# Patient Record
Sex: Male | Born: 1940 | Race: White | Hispanic: No | Marital: Married | Smoking: Never smoker
Health system: Southern US, Community
[De-identification: ages and names within clinical notes are randomized; demographics above are authoritative.]

## PROBLEM LIST (undated history)

## (undated) DIAGNOSIS — N529 Male erectile dysfunction, unspecified: Secondary | ICD-10-CM

## (undated) DIAGNOSIS — R7303 Prediabetes: Secondary | ICD-10-CM

## (undated) DIAGNOSIS — M722 Plantar fascial fibromatosis: Secondary | ICD-10-CM

## (undated) DIAGNOSIS — E781 Pure hyperglyceridemia: Secondary | ICD-10-CM

## (undated) DIAGNOSIS — M5412 Radiculopathy, cervical region: Secondary | ICD-10-CM

## (undated) DIAGNOSIS — M7710 Lateral epicondylitis, unspecified elbow: Secondary | ICD-10-CM

## (undated) DIAGNOSIS — H269 Unspecified cataract: Secondary | ICD-10-CM

## (undated) DIAGNOSIS — K6389 Other specified diseases of intestine: Secondary | ICD-10-CM

## (undated) DIAGNOSIS — B3781 Candidal esophagitis: Secondary | ICD-10-CM

## (undated) DIAGNOSIS — D126 Benign neoplasm of colon, unspecified: Secondary | ICD-10-CM

## (undated) DIAGNOSIS — R51 Headache: Secondary | ICD-10-CM

## (undated) DIAGNOSIS — R519 Headache, unspecified: Secondary | ICD-10-CM

## (undated) DIAGNOSIS — M5416 Radiculopathy, lumbar region: Secondary | ICD-10-CM

## (undated) DIAGNOSIS — J309 Allergic rhinitis, unspecified: Secondary | ICD-10-CM

## (undated) DIAGNOSIS — M1990 Unspecified osteoarthritis, unspecified site: Secondary | ICD-10-CM

## (undated) DIAGNOSIS — S46219A Strain of muscle, fascia and tendon of other parts of biceps, unspecified arm, initial encounter: Secondary | ICD-10-CM

## (undated) DIAGNOSIS — K219 Gastro-esophageal reflux disease without esophagitis: Secondary | ICD-10-CM

## (undated) HISTORY — DX: Plantar fascial fibromatosis: M72.2

## (undated) HISTORY — DX: Gastro-esophageal reflux disease without esophagitis: K21.9

## (undated) HISTORY — DX: Radiculopathy, cervical region: M54.12

## (undated) HISTORY — DX: Allergic rhinitis, unspecified: J30.9

## (undated) HISTORY — DX: Unspecified osteoarthritis, unspecified site: M19.90

## (undated) HISTORY — DX: Headache, unspecified: R51.9

## (undated) HISTORY — DX: Benign neoplasm of colon, unspecified: D12.6

## (undated) HISTORY — DX: Radiculopathy, lumbar region: M54.16

## (undated) HISTORY — DX: Unspecified cataract: H26.9

## (undated) HISTORY — PX: TONSILLECTOMY: SUR1361

## (undated) HISTORY — DX: Strain of muscle, fascia and tendon of other parts of biceps, unspecified arm, initial encounter: S46.219A

## (undated) HISTORY — PX: EYE SURGERY: SHX253

## (undated) HISTORY — PX: COLONOSCOPY: SHX174

## (undated) HISTORY — DX: Other specified diseases of intestine: K63.89

## (undated) HISTORY — DX: Pure hyperglyceridemia: E78.1

## (undated) HISTORY — DX: Headache: R51

## (undated) HISTORY — DX: Lateral epicondylitis, unspecified elbow: M77.10

## (undated) HISTORY — DX: Candidal esophagitis: B37.81

## (undated) HISTORY — DX: Male erectile dysfunction, unspecified: N52.9

---

## 1991-12-11 HISTORY — PX: LUMBAR LAMINECTOMY: SHX95

## 2000-03-08 ENCOUNTER — Ambulatory Visit (HOSPITAL_COMMUNITY): Admission: RE | Admit: 2000-03-08 | Discharge: 2000-03-08 | Payer: Self-pay | Admitting: *Deleted

## 2001-03-03 ENCOUNTER — Encounter: Admission: RE | Admit: 2001-03-03 | Discharge: 2001-03-03 | Payer: Self-pay | Admitting: Internal Medicine

## 2001-03-03 ENCOUNTER — Encounter: Payer: Self-pay | Admitting: Internal Medicine

## 2001-03-10 ENCOUNTER — Encounter: Payer: Self-pay | Admitting: Internal Medicine

## 2001-03-10 ENCOUNTER — Ambulatory Visit (HOSPITAL_COMMUNITY): Admission: RE | Admit: 2001-03-10 | Discharge: 2001-03-10 | Payer: Self-pay | Admitting: Internal Medicine

## 2004-12-14 ENCOUNTER — Encounter: Admission: RE | Admit: 2004-12-14 | Discharge: 2004-12-14 | Payer: Self-pay | Admitting: Internal Medicine

## 2011-12-26 DIAGNOSIS — R079 Chest pain, unspecified: Secondary | ICD-10-CM | POA: Diagnosis not present

## 2012-01-04 DIAGNOSIS — R079 Chest pain, unspecified: Secondary | ICD-10-CM | POA: Diagnosis not present

## 2012-04-29 DIAGNOSIS — L570 Actinic keratosis: Secondary | ICD-10-CM | POA: Diagnosis not present

## 2012-04-29 DIAGNOSIS — D235 Other benign neoplasm of skin of trunk: Secondary | ICD-10-CM | POA: Diagnosis not present

## 2012-06-04 DIAGNOSIS — N529 Male erectile dysfunction, unspecified: Secondary | ICD-10-CM | POA: Diagnosis not present

## 2012-06-04 DIAGNOSIS — Z131 Encounter for screening for diabetes mellitus: Secondary | ICD-10-CM | POA: Diagnosis not present

## 2012-06-04 DIAGNOSIS — Z1331 Encounter for screening for depression: Secondary | ICD-10-CM | POA: Diagnosis not present

## 2012-06-04 DIAGNOSIS — Z0000 Encounter for general adult medical examination without abnormal findings: Secondary | ICD-10-CM | POA: Diagnosis not present

## 2012-06-24 DIAGNOSIS — Z09 Encounter for follow-up examination after completed treatment for conditions other than malignant neoplasm: Secondary | ICD-10-CM | POA: Diagnosis not present

## 2012-06-24 DIAGNOSIS — Z86010 Personal history of colonic polyps: Secondary | ICD-10-CM | POA: Diagnosis not present

## 2012-06-24 DIAGNOSIS — D126 Benign neoplasm of colon, unspecified: Secondary | ICD-10-CM | POA: Diagnosis not present

## 2012-07-29 DIAGNOSIS — H01009 Unspecified blepharitis unspecified eye, unspecified eyelid: Secondary | ICD-10-CM | POA: Diagnosis not present

## 2012-07-29 DIAGNOSIS — H04209 Unspecified epiphora, unspecified lacrimal gland: Secondary | ICD-10-CM | POA: Diagnosis not present

## 2012-07-29 DIAGNOSIS — H11049 Peripheral pterygium, stationary, unspecified eye: Secondary | ICD-10-CM | POA: Diagnosis not present

## 2012-07-29 DIAGNOSIS — H2510 Age-related nuclear cataract, unspecified eye: Secondary | ICD-10-CM | POA: Diagnosis not present

## 2012-09-24 DIAGNOSIS — J209 Acute bronchitis, unspecified: Secondary | ICD-10-CM | POA: Diagnosis not present

## 2012-10-19 DIAGNOSIS — Z23 Encounter for immunization: Secondary | ICD-10-CM | POA: Diagnosis not present

## 2012-10-29 DIAGNOSIS — M722 Plantar fascial fibromatosis: Secondary | ICD-10-CM | POA: Diagnosis not present

## 2012-11-15 DIAGNOSIS — M25519 Pain in unspecified shoulder: Secondary | ICD-10-CM | POA: Diagnosis not present

## 2012-11-17 DIAGNOSIS — M25529 Pain in unspecified elbow: Secondary | ICD-10-CM | POA: Diagnosis not present

## 2012-11-26 DIAGNOSIS — L570 Actinic keratosis: Secondary | ICD-10-CM | POA: Diagnosis not present

## 2012-11-26 DIAGNOSIS — L578 Other skin changes due to chronic exposure to nonionizing radiation: Secondary | ICD-10-CM | POA: Diagnosis not present

## 2012-12-01 DIAGNOSIS — S43499A Other sprain of unspecified shoulder joint, initial encounter: Secondary | ICD-10-CM | POA: Diagnosis not present

## 2012-12-01 DIAGNOSIS — S46819A Strain of other muscles, fascia and tendons at shoulder and upper arm level, unspecified arm, initial encounter: Secondary | ICD-10-CM | POA: Diagnosis not present

## 2013-05-27 DIAGNOSIS — D235 Other benign neoplasm of skin of trunk: Secondary | ICD-10-CM | POA: Diagnosis not present

## 2013-05-27 DIAGNOSIS — L578 Other skin changes due to chronic exposure to nonionizing radiation: Secondary | ICD-10-CM | POA: Diagnosis not present

## 2013-05-27 DIAGNOSIS — L819 Disorder of pigmentation, unspecified: Secondary | ICD-10-CM | POA: Diagnosis not present

## 2013-06-08 DIAGNOSIS — J309 Allergic rhinitis, unspecified: Secondary | ICD-10-CM | POA: Diagnosis not present

## 2013-06-08 DIAGNOSIS — Z0000 Encounter for general adult medical examination without abnormal findings: Secondary | ICD-10-CM | POA: Diagnosis not present

## 2013-06-08 DIAGNOSIS — Z1331 Encounter for screening for depression: Secondary | ICD-10-CM | POA: Diagnosis not present

## 2013-06-08 DIAGNOSIS — K219 Gastro-esophageal reflux disease without esophagitis: Secondary | ICD-10-CM | POA: Diagnosis not present

## 2013-06-08 DIAGNOSIS — Z131 Encounter for screening for diabetes mellitus: Secondary | ICD-10-CM | POA: Diagnosis not present

## 2013-09-18 DIAGNOSIS — Z23 Encounter for immunization: Secondary | ICD-10-CM | POA: Diagnosis not present

## 2013-11-12 DIAGNOSIS — M67919 Unspecified disorder of synovium and tendon, unspecified shoulder: Secondary | ICD-10-CM | POA: Diagnosis not present

## 2013-12-09 ENCOUNTER — Other Ambulatory Visit: Payer: Self-pay | Admitting: Emergency Medicine

## 2013-12-09 DIAGNOSIS — M542 Cervicalgia: Secondary | ICD-10-CM | POA: Diagnosis not present

## 2013-12-09 DIAGNOSIS — G5690 Unspecified mononeuropathy of unspecified upper limb: Secondary | ICD-10-CM | POA: Diagnosis not present

## 2013-12-11 ENCOUNTER — Ambulatory Visit
Admission: RE | Admit: 2013-12-11 | Discharge: 2013-12-11 | Disposition: A | Discharge status: Home / Self Care | Payer: Medicare Other | Source: Ambulatory Visit | Attending: Emergency Medicine | Admitting: Emergency Medicine

## 2013-12-11 ENCOUNTER — Other Ambulatory Visit: Payer: Self-pay | Admitting: Emergency Medicine

## 2013-12-11 DIAGNOSIS — M47812 Spondylosis without myelopathy or radiculopathy, cervical region: Secondary | ICD-10-CM | POA: Diagnosis not present

## 2013-12-11 DIAGNOSIS — M542 Cervicalgia: Secondary | ICD-10-CM

## 2013-12-11 DIAGNOSIS — T1590XA Foreign body on external eye, part unspecified, unspecified eye, initial encounter: Secondary | ICD-10-CM

## 2013-12-11 DIAGNOSIS — Z135 Encounter for screening for eye and ear disorders: Secondary | ICD-10-CM | POA: Diagnosis not present

## 2013-12-29 DIAGNOSIS — G5600 Carpal tunnel syndrome, unspecified upper limb: Secondary | ICD-10-CM | POA: Diagnosis not present

## 2013-12-29 DIAGNOSIS — M4802 Spinal stenosis, cervical region: Secondary | ICD-10-CM | POA: Diagnosis not present

## 2013-12-29 DIAGNOSIS — M5412 Radiculopathy, cervical region: Secondary | ICD-10-CM | POA: Diagnosis not present

## 2013-12-29 DIAGNOSIS — M5030 Other cervical disc degeneration, unspecified cervical region: Secondary | ICD-10-CM | POA: Diagnosis not present

## 2014-01-05 DIAGNOSIS — M47812 Spondylosis without myelopathy or radiculopathy, cervical region: Secondary | ICD-10-CM | POA: Diagnosis not present

## 2014-01-05 DIAGNOSIS — M5412 Radiculopathy, cervical region: Secondary | ICD-10-CM | POA: Diagnosis not present

## 2014-01-07 DIAGNOSIS — M47812 Spondylosis without myelopathy or radiculopathy, cervical region: Secondary | ICD-10-CM | POA: Diagnosis not present

## 2014-01-07 DIAGNOSIS — M5412 Radiculopathy, cervical region: Secondary | ICD-10-CM | POA: Diagnosis not present

## 2014-01-11 DIAGNOSIS — M5412 Radiculopathy, cervical region: Secondary | ICD-10-CM | POA: Diagnosis not present

## 2014-01-11 DIAGNOSIS — M47812 Spondylosis without myelopathy or radiculopathy, cervical region: Secondary | ICD-10-CM | POA: Diagnosis not present

## 2014-01-13 DIAGNOSIS — M5412 Radiculopathy, cervical region: Secondary | ICD-10-CM | POA: Diagnosis not present

## 2014-01-13 DIAGNOSIS — M47812 Spondylosis without myelopathy or radiculopathy, cervical region: Secondary | ICD-10-CM | POA: Diagnosis not present

## 2014-01-15 DIAGNOSIS — M47812 Spondylosis without myelopathy or radiculopathy, cervical region: Secondary | ICD-10-CM | POA: Diagnosis not present

## 2014-01-15 DIAGNOSIS — M5412 Radiculopathy, cervical region: Secondary | ICD-10-CM | POA: Diagnosis not present

## 2014-01-20 DIAGNOSIS — M47812 Spondylosis without myelopathy or radiculopathy, cervical region: Secondary | ICD-10-CM | POA: Diagnosis not present

## 2014-01-20 DIAGNOSIS — M5412 Radiculopathy, cervical region: Secondary | ICD-10-CM | POA: Diagnosis not present

## 2014-01-22 DIAGNOSIS — M47812 Spondylosis without myelopathy or radiculopathy, cervical region: Secondary | ICD-10-CM | POA: Diagnosis not present

## 2014-01-22 DIAGNOSIS — M5412 Radiculopathy, cervical region: Secondary | ICD-10-CM | POA: Diagnosis not present

## 2014-01-27 DIAGNOSIS — M47812 Spondylosis without myelopathy or radiculopathy, cervical region: Secondary | ICD-10-CM | POA: Diagnosis not present

## 2014-01-27 DIAGNOSIS — M5412 Radiculopathy, cervical region: Secondary | ICD-10-CM | POA: Diagnosis not present

## 2014-01-29 DIAGNOSIS — M5412 Radiculopathy, cervical region: Secondary | ICD-10-CM | POA: Diagnosis not present

## 2014-01-29 DIAGNOSIS — M47812 Spondylosis without myelopathy or radiculopathy, cervical region: Secondary | ICD-10-CM | POA: Diagnosis not present

## 2014-02-05 DIAGNOSIS — M47812 Spondylosis without myelopathy or radiculopathy, cervical region: Secondary | ICD-10-CM | POA: Diagnosis not present

## 2014-02-05 DIAGNOSIS — R82998 Other abnormal findings in urine: Secondary | ICD-10-CM | POA: Diagnosis not present

## 2014-02-05 DIAGNOSIS — N390 Urinary tract infection, site not specified: Secondary | ICD-10-CM | POA: Diagnosis not present

## 2014-02-05 DIAGNOSIS — M5412 Radiculopathy, cervical region: Secondary | ICD-10-CM | POA: Diagnosis not present

## 2014-02-10 DIAGNOSIS — M5412 Radiculopathy, cervical region: Secondary | ICD-10-CM | POA: Diagnosis not present

## 2014-02-10 DIAGNOSIS — M47812 Spondylosis without myelopathy or radiculopathy, cervical region: Secondary | ICD-10-CM | POA: Diagnosis not present

## 2014-02-10 DIAGNOSIS — R109 Unspecified abdominal pain: Secondary | ICD-10-CM | POA: Diagnosis not present

## 2014-02-12 DIAGNOSIS — M5412 Radiculopathy, cervical region: Secondary | ICD-10-CM | POA: Diagnosis not present

## 2014-02-12 DIAGNOSIS — M47812 Spondylosis without myelopathy or radiculopathy, cervical region: Secondary | ICD-10-CM | POA: Diagnosis not present

## 2014-02-15 DIAGNOSIS — R1084 Generalized abdominal pain: Secondary | ICD-10-CM | POA: Diagnosis not present

## 2014-02-18 DIAGNOSIS — M5412 Radiculopathy, cervical region: Secondary | ICD-10-CM | POA: Diagnosis not present

## 2014-02-18 DIAGNOSIS — M47812 Spondylosis without myelopathy or radiculopathy, cervical region: Secondary | ICD-10-CM | POA: Diagnosis not present

## 2014-06-01 DIAGNOSIS — M722 Plantar fascial fibromatosis: Secondary | ICD-10-CM | POA: Diagnosis not present

## 2014-06-01 DIAGNOSIS — M7750 Other enthesopathy of unspecified foot: Secondary | ICD-10-CM | POA: Diagnosis not present

## 2014-06-08 DIAGNOSIS — L819 Disorder of pigmentation, unspecified: Secondary | ICD-10-CM | POA: Diagnosis not present

## 2014-06-08 DIAGNOSIS — L821 Other seborrheic keratosis: Secondary | ICD-10-CM | POA: Diagnosis not present

## 2014-06-08 DIAGNOSIS — L570 Actinic keratosis: Secondary | ICD-10-CM | POA: Diagnosis not present

## 2014-06-08 DIAGNOSIS — D235 Other benign neoplasm of skin of trunk: Secondary | ICD-10-CM | POA: Diagnosis not present

## 2014-06-09 DIAGNOSIS — Z131 Encounter for screening for diabetes mellitus: Secondary | ICD-10-CM | POA: Diagnosis not present

## 2014-06-09 DIAGNOSIS — Z0000 Encounter for general adult medical examination without abnormal findings: Secondary | ICD-10-CM | POA: Diagnosis not present

## 2014-06-09 DIAGNOSIS — Z1331 Encounter for screening for depression: Secondary | ICD-10-CM | POA: Diagnosis not present

## 2014-06-09 DIAGNOSIS — Z23 Encounter for immunization: Secondary | ICD-10-CM | POA: Diagnosis not present

## 2014-06-09 DIAGNOSIS — K219 Gastro-esophageal reflux disease without esophagitis: Secondary | ICD-10-CM | POA: Diagnosis not present

## 2014-07-09 ENCOUNTER — Encounter: Payer: Self-pay | Admitting: *Deleted

## 2014-07-28 DIAGNOSIS — M25519 Pain in unspecified shoulder: Secondary | ICD-10-CM | POA: Diagnosis not present

## 2014-08-24 DIAGNOSIS — H04129 Dry eye syndrome of unspecified lacrimal gland: Secondary | ICD-10-CM | POA: Diagnosis not present

## 2014-08-24 DIAGNOSIS — H3530 Unspecified macular degeneration: Secondary | ICD-10-CM | POA: Diagnosis not present

## 2014-08-24 DIAGNOSIS — H269 Unspecified cataract: Secondary | ICD-10-CM | POA: Diagnosis not present

## 2014-10-22 DIAGNOSIS — Z23 Encounter for immunization: Secondary | ICD-10-CM | POA: Diagnosis not present

## 2014-12-09 DIAGNOSIS — M722 Plantar fascial fibromatosis: Secondary | ICD-10-CM | POA: Diagnosis not present

## 2015-03-29 ENCOUNTER — Emergency Department (HOSPITAL_COMMUNITY): Payer: Medicare Other

## 2015-03-29 ENCOUNTER — Emergency Department (HOSPITAL_COMMUNITY)
Admission: EM | Admit: 2015-03-29 | Discharge: 2015-03-29 | Disposition: A | Discharge status: Home / Self Care | Payer: Medicare Other | Attending: Emergency Medicine | Admitting: Emergency Medicine

## 2015-03-29 ENCOUNTER — Encounter (HOSPITAL_COMMUNITY): Payer: Self-pay | Admitting: Neurology

## 2015-03-29 DIAGNOSIS — Z8639 Personal history of other endocrine, nutritional and metabolic disease: Secondary | ICD-10-CM | POA: Insufficient documentation

## 2015-03-29 DIAGNOSIS — R079 Chest pain, unspecified: Secondary | ICD-10-CM | POA: Diagnosis present

## 2015-03-29 DIAGNOSIS — Z86018 Personal history of other benign neoplasm: Secondary | ICD-10-CM | POA: Diagnosis not present

## 2015-03-29 DIAGNOSIS — Z87828 Personal history of other (healed) physical injury and trauma: Secondary | ICD-10-CM | POA: Insufficient documentation

## 2015-03-29 DIAGNOSIS — Z7982 Long term (current) use of aspirin: Secondary | ICD-10-CM | POA: Diagnosis not present

## 2015-03-29 DIAGNOSIS — Z8739 Personal history of other diseases of the musculoskeletal system and connective tissue: Secondary | ICD-10-CM | POA: Insufficient documentation

## 2015-03-29 DIAGNOSIS — Z7952 Long term (current) use of systemic steroids: Secondary | ICD-10-CM | POA: Insufficient documentation

## 2015-03-29 DIAGNOSIS — R0789 Other chest pain: Secondary | ICD-10-CM | POA: Diagnosis not present

## 2015-03-29 DIAGNOSIS — Z8709 Personal history of other diseases of the respiratory system: Secondary | ICD-10-CM | POA: Diagnosis not present

## 2015-03-29 DIAGNOSIS — M1990 Unspecified osteoarthritis, unspecified site: Secondary | ICD-10-CM | POA: Diagnosis not present

## 2015-03-29 DIAGNOSIS — Z87448 Personal history of other diseases of urinary system: Secondary | ICD-10-CM | POA: Diagnosis not present

## 2015-03-29 DIAGNOSIS — K219 Gastro-esophageal reflux disease without esophagitis: Secondary | ICD-10-CM | POA: Diagnosis not present

## 2015-03-29 DIAGNOSIS — Z79899 Other long term (current) drug therapy: Secondary | ICD-10-CM | POA: Insufficient documentation

## 2015-03-29 LAB — CBC
HEMATOCRIT: 42.5 % (ref 39.0–52.0)
Hemoglobin: 14.6 g/dL (ref 13.0–17.0)
MCH: 34 pg (ref 26.0–34.0)
MCHC: 34.4 g/dL (ref 30.0–36.0)
MCV: 99.1 fL (ref 78.0–100.0)
Platelets: 262 10*3/uL (ref 150–400)
RBC: 4.29 MIL/uL (ref 4.22–5.81)
RDW: 13.2 % (ref 11.5–15.5)
WBC: 11 10*3/uL — ABNORMAL HIGH (ref 4.0–10.5)

## 2015-03-29 LAB — BASIC METABOLIC PANEL
Anion gap: 7 (ref 5–15)
BUN: 16 mg/dL (ref 6–23)
CHLORIDE: 104 mmol/L (ref 96–112)
CO2: 27 mmol/L (ref 19–32)
CREATININE: 1.12 mg/dL (ref 0.50–1.35)
Calcium: 9.2 mg/dL (ref 8.4–10.5)
GFR calc Af Amer: 72 mL/min — ABNORMAL LOW (ref >90)
GFR calc non Af Amer: 62 mL/min — ABNORMAL LOW (ref >90)
GLUCOSE: 110 mg/dL — AB (ref 70–99)
Potassium: 4.2 mmol/L (ref 3.5–5.1)
Sodium: 138 mmol/L (ref 135–145)

## 2015-03-29 LAB — I-STAT TROPONIN, ED
Troponin i, poc: 0 ng/mL (ref 0.00–0.08)
Troponin i, poc: 0 ng/mL (ref 0.00–0.08)

## 2015-03-29 MED ORDER — FAMOTIDINE 20 MG PO TABS
20.0000 mg | ORAL_TABLET | Freq: Once | ORAL | Status: AC
  Administered 2015-03-29: 20 mg via ORAL
  Filled 2015-03-29: qty 1

## 2015-03-29 MED ORDER — GI COCKTAIL ~~LOC~~
30.0000 mL | Freq: Once | ORAL | Status: AC
  Administered 2015-03-29: 30 mL via ORAL
  Filled 2015-03-29: qty 30

## 2015-03-29 NOTE — ED Provider Notes (Signed)
CSN: [id number]     Arrival date & time 03/29/15  1211 History   First MD Initiated Contact with Patient 03/29/15 1225     Chief Complaint  Patient presents with  . Chest Pain     (Consider location/radiation/quality/duration/timing/severity/associated sxs/prior Treatment) HPI   Kristopher [name] is a 74 y.o. male complaining of left-sided, nonradiating chest pain described as aching, 2 out of 10, nonexertional, nonpleuritic and non-positional onset 2 weeks ago. Patient denies cough, fever, chills, nausea, vomiting, diaphoresis, history of DVT or PE, recent immobilization, calf pain or leg swelling. States he had a negative stress test 3 years ago, does not follow with cardiology regularly. Takes a daily low-dose aspirin. Denies history of smoking, hypertension, hyperlipidemia, diabetes.   Past Medical History  Diagnosis Date  . Esophageal reflux   . Allergic rhinitis   . Colon adenoma   . Frequent headaches   . OA (osteoarthritis)   . Lateral epicondylitis   . Hyperglyceridemia   . ED (erectile dysfunction)   . Biceps tendon rupture   . Plantar fasciitis   . C6 radiculopathy   . Lumbar radiculopathy    Past Surgical History  Procedure Laterality Date  . Appendectomy    . Lumbar laminectomy    . Colonoscopy     Family History  Problem Relation Age of Onset  . COPD Father   . Congestive Heart Failure Father   . CAD Father   . Pancreatic cancer Mother   . GER disease Mother   . Anxiety disorder Mother    History  Substance Use Topics  . Smoking status: Never Smoker   . Smokeless tobacco: Not on file  . Alcohol Use: No    Review of Systems  10 systems reviewed and found to be negative, except as noted in the HPI.  Allergies  Review of patient's allergies indicates no known allergies.  Home Medications   Prior to Admission medications   Medication Sig Start Date End Date Taking? Authorizing Provider  aspirin 325 MG tablet Take 325 mg by mouth daily.   Yes  Historical Provider, MD  azelastine (OPTIVAR) 0.05 % ophthalmic solution Place 1 drop into both eyes 2 (two) times daily as needed (redness).    Yes Historical Provider, MD  Cyanocobalamin (VITAMIN B-12 PO) Take 1 tablet by mouth daily.   Yes Historical Provider, MD  fluticasone (FLONASE) 50 MCG/ACT nasal spray Place into both nostrils daily.   Yes Historical Provider, MD  Melatonin 10 MG CAPS Take 1 capsule by mouth at bedtime.   Yes Historical Provider, MD  oxymetazoline (AFRIN) 0.05 % nasal spray Place 1 spray into both nostrils 2 (two) times daily as needed for congestion.   Yes Historical Provider, MD  ranitidine (ZANTAC) 150 MG capsule Take 150 mg by mouth 2 (two) times daily.   Yes Historical Provider, MD   BP 131/77 mmHg  Pulse 54  Temp(Src) 98.1 F (36.7 C) (Oral)  Resp 18  SpO2 100% Physical Exam  Constitutional: He is oriented to person, place, and time. He appears well-developed and well-nourished. No distress.  HENT:  Head: Normocephalic and atraumatic.  Mouth/Throat: Oropharynx is clear and moist.  Eyes: Conjunctivae and EOM are normal. Pupils are equal, round, and reactive to light.  Neck: Normal range of motion. No JVD present.  Cardiovascular: Normal rate, regular rhythm and intact distal pulses.   Pulmonary/Chest: Effort normal and breath sounds normal. No stridor. No respiratory distress. He has no wheezes. He has no rales.  He exhibits no tenderness.  Abdominal: Soft. Bowel sounds are normal. He exhibits no distension and no mass. There is no tenderness. There is no rebound and no guarding.  Musculoskeletal: Normal range of motion. He exhibits no edema or tenderness.  No calf asymmetry, superficial collaterals, palpable cords, edema, Homans sign negative bilaterally.    Neurological: He is alert and oriented to person, place, and time.  Psychiatric: He has a normal mood and affect.  Nursing note and vitals reviewed.   ED Course  Procedures (including critical care  time) Labs Review Labs Reviewed  CBC - Abnormal; Notable for the following:    WBC 11.0 (*)    All other components within normal limits  BASIC METABOLIC PANEL - Abnormal; Notable for the following:    Glucose, Bld 110 (*)    GFR calc non Af Amer 62 (*)    GFR calc Af Amer 72 (*)    All other components within normal limits  I-STAT TROPOININ, ED  Randolm Idol, ED    Imaging Review Dg Chest 2 View  03/29/2015   CLINICAL DATA:  Chest pain for 2 days  EXAM: CHEST  2 VIEW  COMPARISON:  12/14/2004  FINDINGS: Cardiomediastinal silhouette is unremarkable. No acute infiltrate or pleural effusion. No pulmonary edema. Mild degenerative changes mid and lower thoracic spine.  IMPRESSION: No active cardiopulmonary disease.   Electronically Signed   By: Lahoma Crocker M.D.   On: 03/29/2015 13:42     EKG Interpretation   Date/Time:  Tuesday March 29 2015 12:19:49 EDT Ventricular Rate:  63 PR Interval:  198 QRS Duration: 98 QT Interval:  384 QTC Calculation: 392 R Axis:   -34 Text Interpretation:  Normal sinus rhythm Left axis deviation Abnormal ECG  No prior for comparison Confirmed by Mingo Amber  MD, BLAIR ([id number]) on 03/29/2015  1:51:00 PM      MDM   Final diagnoses:  Atypical chest pain   Filed Vitals:   03/29/15 1221 03/29/15 1336 03/29/15 1558  BP: 126/87 141/79 131/77  Pulse: 60  54  Temp: 98.1 F (36.7 C)    TempSrc: Oral    Resp: 14 18 18   SpO2: 96%  100%    Medications  gi cocktail (Maalox,Lidocaine,Donnatal) (30 mLs Oral Given 03/29/15 1332)  famotidine (PEPCID) tablet 20 mg (20 mg Oral Given 03/29/15 1332)    Kristopher [name] is a pleasant 74 y.o. male presenting with left-sided nonradiating chest pain worsening over the course of several weeks. Patient is low risk by heart score, EKG is nonischemic, troponin is negative, blood work with no abnormality.   Delta troponin is negative, doubt this is ACS. Discussed case with attending physician who is personally evaluated this  patient and agrees with care plan and stability to discharge to home.  I've called his new primary care doctor, Dr. Delfina Redwood who is with [facility] and have arranged for expedited care for him tomorrow. 8:15   Evaluation does not show pathology that would require ongoing emergent intervention or inpatient treatment. Pt is hemodynamically stable and mentating appropriately. Discussed findings and plan with patient/guardian, who agrees with care plan. All questions answered. Return precautions discussed and outpatient follow up given.     Monico Blitz, PA-C 03/29/15 1615  Evelina Bucy, MD 03/29/15 [phone]

## 2015-03-29 NOTE — Discharge Instructions (Signed)
Please follow with your primary care doctor in the next 2 days for a check-up. They must obtain records for further management.   Do not hesitate to return to the Emergency Department for any new, worsening or concerning symptoms.    Chest Pain (Nonspecific) It is often hard to give a specific diagnosis for the cause of chest pain. There is always a chance that your pain could be related to something serious, such as a heart attack or a blood clot in the lungs. You need to follow up with your health care provider for further evaluation. CAUSES   Heartburn.  Pneumonia or bronchitis.  Anxiety or stress.  Inflammation around your heart (pericarditis) or lung (pleuritis or pleurisy).  A blood clot in the lung.  A collapsed lung (pneumothorax). It can develop suddenly on its own (spontaneous pneumothorax) or from trauma to the chest.  Shingles infection (herpes zoster virus). The chest wall is composed of bones, muscles, and cartilage. Any of these can be the source of the pain.  The bones can be bruised by injury.  The muscles or cartilage can be strained by coughing or overwork.  The cartilage can be affected by inflammation and become sore (costochondritis). DIAGNOSIS  Lab tests or other studies may be needed to find the cause of your pain. Your health care provider may have you take a test called an ambulatory electrocardiogram (ECG). An ECG records your heartbeat patterns over a 24-hour period. You may also have other tests, such as:  Transthoracic echocardiogram (TTE). During echocardiography, sound waves are used to evaluate how blood flows through your heart.  Transesophageal echocardiogram (TEE).  Cardiac monitoring. This allows your health care provider to monitor your heart rate and rhythm in real time.  Holter monitor. This is a portable device that records your heartbeat and can help diagnose heart arrhythmias. It allows your health care provider to track your heart  activity for several days, if needed.  Stress tests by exercise or by giving medicine that makes the heart beat faster. TREATMENT   Treatment depends on what may be causing your chest pain. Treatment may include:  Acid blockers for heartburn.  Anti-inflammatory medicine.  Pain medicine for inflammatory conditions.  Antibiotics if an infection is present.  You may be advised to change lifestyle habits. This includes stopping smoking and avoiding alcohol, caffeine, and chocolate.  You may be advised to keep your head raised (elevated) when sleeping. This reduces the chance of acid going backward from your stomach into your esophagus. Most of the time, nonspecific chest pain will improve within 2-3 days with rest and mild pain medicine.  HOME CARE INSTRUCTIONS   If antibiotics were prescribed, take them as directed. Finish them even if you start to feel better.  For the next few days, avoid physical activities that bring on chest pain. Continue physical activities as directed.  Do not use any tobacco products, including cigarettes, chewing tobacco, or electronic cigarettes.  Avoid drinking alcohol.  Only take medicine as directed by your health care provider.  Follow your health care provider's suggestions for further testing if your chest pain does not go away.  Keep any follow-up appointments you made. If you do not go to an appointment, you could develop lasting (chronic) problems with pain. If there is any problem keeping an appointment, call to reschedule. SEEK MEDICAL CARE IF:   Your chest pain does not go away, even after treatment.  You have a rash with blisters on your chest.  You have a fever. SEEK IMMEDIATE MEDICAL CARE IF:   You have increased chest pain or pain that spreads to your arm, neck, jaw, back, or abdomen.  You have shortness of breath.  You have an increasing cough, or you cough up blood.  You have severe back or abdominal pain.  You feel  nauseous or vomit.  You have severe weakness.  You faint.  You have chills. This is an emergency. Do not wait to see if the pain will go away. Get medical help at once. Call your local emergency services (911 in [place] Do not drive yourself to the hospital. MAKE SURE YOU:   Understand these instructions.  Will watch your condition.  Will get help right away if you are not doing well or get worse. Document Released: 09/05/2005 Document Revised: 12/01/2013 Document Reviewed: 07/01/2008 [facility] Patient Information 2015 [place], [place]. This information is not intended to replace advice given to you by your health care provider. Make sure you discuss any questions you have with your health care provider.

## 2015-03-29 NOTE — ED Notes (Signed)
Pt reports left sided cp for several weeks; told his wife about the pain and he went to [place] today and sent here. Reports 2/10 cp. Denies n/v/sob. Pt is a x 4.

## 2015-03-29 NOTE — ED Notes (Signed)
Patient transported to X-ray 

## 2015-11-21 DIAGNOSIS — Z86010 Personal history of colonic polyps: Secondary | ICD-10-CM | POA: Insufficient documentation

## 2016-02-13 ENCOUNTER — Emergency Department (HOSPITAL_COMMUNITY): Payer: Medicare Other

## 2016-02-13 ENCOUNTER — Encounter (HOSPITAL_COMMUNITY): Payer: Self-pay | Admitting: *Deleted

## 2016-02-13 ENCOUNTER — Emergency Department (HOSPITAL_COMMUNITY)
Admission: EM | Admit: 2016-02-13 | Discharge: 2016-02-13 | Disposition: A | Discharge status: Home / Self Care | Payer: Medicare Other | Attending: Emergency Medicine | Admitting: Emergency Medicine

## 2016-02-13 DIAGNOSIS — Z86018 Personal history of other benign neoplasm: Secondary | ICD-10-CM | POA: Diagnosis not present

## 2016-02-13 DIAGNOSIS — Z87438 Personal history of other diseases of male genital organs: Secondary | ICD-10-CM | POA: Insufficient documentation

## 2016-02-13 DIAGNOSIS — Z79899 Other long term (current) drug therapy: Secondary | ICD-10-CM | POA: Insufficient documentation

## 2016-02-13 DIAGNOSIS — M1990 Unspecified osteoarthritis, unspecified site: Secondary | ICD-10-CM | POA: Insufficient documentation

## 2016-02-13 DIAGNOSIS — Z8639 Personal history of other endocrine, nutritional and metabolic disease: Secondary | ICD-10-CM | POA: Diagnosis not present

## 2016-02-13 DIAGNOSIS — K219 Gastro-esophageal reflux disease without esophagitis: Secondary | ICD-10-CM | POA: Diagnosis not present

## 2016-02-13 DIAGNOSIS — A084 Viral intestinal infection, unspecified: Secondary | ICD-10-CM | POA: Insufficient documentation

## 2016-02-13 DIAGNOSIS — Z7951 Long term (current) use of inhaled steroids: Secondary | ICD-10-CM | POA: Diagnosis not present

## 2016-02-13 DIAGNOSIS — R1032 Left lower quadrant pain: Secondary | ICD-10-CM | POA: Diagnosis present

## 2016-02-13 LAB — COMPREHENSIVE METABOLIC PANEL
ALBUMIN: 4.3 g/dL (ref 3.5–5.0)
ALT: 15 U/L — ABNORMAL LOW (ref 17–63)
AST: 26 U/L (ref 15–41)
Alkaline Phosphatase: 62 U/L (ref 38–126)
Anion gap: 15 (ref 5–15)
BUN: 20 mg/dL (ref 6–20)
CHLORIDE: 102 mmol/L (ref 101–111)
CO2: 24 mmol/L (ref 22–32)
CREATININE: 1.38 mg/dL — AB (ref 0.61–1.24)
Calcium: 9.8 mg/dL (ref 8.9–10.3)
GFR calc non Af Amer: 48 mL/min — ABNORMAL LOW (ref >60)
GFR, EST AFRICAN AMERICAN: 56 mL/min — AB (ref >60)
GLUCOSE: 140 mg/dL — AB (ref 65–99)
Potassium: 5 mmol/L (ref 3.5–5.1)
SODIUM: 141 mmol/L (ref 135–145)
Total Bilirubin: 0.9 mg/dL (ref 0.3–1.2)
Total Protein: 7 g/dL (ref 6.5–8.1)

## 2016-02-13 LAB — URINALYSIS, ROUTINE W REFLEX MICROSCOPIC
Glucose, UA: NEGATIVE mg/dL
HGB URINE DIPSTICK: NEGATIVE
Ketones, ur: 15 mg/dL — AB
LEUKOCYTES UA: NEGATIVE
Nitrite: NEGATIVE
Protein, ur: 30 mg/dL — AB
SPECIFIC GRAVITY, URINE: 1.037 — AB (ref 1.005–1.030)
pH: 5.5 (ref 5.0–8.0)

## 2016-02-13 LAB — CBC
HCT: 46.5 % (ref 39.0–52.0)
Hemoglobin: 15.6 g/dL (ref 13.0–17.0)
MCH: 34.8 pg — AB (ref 26.0–34.0)
MCHC: 33.5 g/dL (ref 30.0–36.0)
MCV: 103.8 fL — AB (ref 78.0–100.0)
Platelets: 252 10*3/uL (ref 150–400)
RBC: 4.48 MIL/uL (ref 4.22–5.81)
RDW: 13 % (ref 11.5–15.5)
WBC: 11.9 10*3/uL — ABNORMAL HIGH (ref 4.0–10.5)

## 2016-02-13 LAB — LIPASE, BLOOD: LIPASE: 21 U/L (ref 11–51)

## 2016-02-13 LAB — I-STAT TROPONIN, ED: Troponin i, poc: 0.01 ng/mL (ref 0.00–0.08)

## 2016-02-13 LAB — URINE MICROSCOPIC-ADD ON

## 2016-02-13 LAB — I-STAT CG4 LACTIC ACID, ED: LACTIC ACID, VENOUS: 1.45 mmol/L (ref 0.5–2.0)

## 2016-02-13 MED ORDER — SODIUM CHLORIDE 0.9 % IV BOLUS (SEPSIS)
1000.0000 mL | Freq: Once | INTRAVENOUS | Status: AC
  Administered 2016-02-13: 1000 mL via INTRAVENOUS

## 2016-02-13 MED ORDER — DIPHENHYDRAMINE HCL 25 MG PO CAPS
25.0000 mg | ORAL_CAPSULE | Freq: Once | ORAL | Status: AC
  Administered 2016-02-13: 25 mg via ORAL
  Filled 2016-02-13: qty 1

## 2016-02-13 MED ORDER — METOCLOPRAMIDE HCL 5 MG/ML IJ SOLN
10.0000 mg | Freq: Once | INTRAMUSCULAR | Status: AC
  Administered 2016-02-13: 10 mg via INTRAVENOUS
  Filled 2016-02-13: qty 2

## 2016-02-13 MED ORDER — GI COCKTAIL ~~LOC~~
30.0000 mL | Freq: Once | ORAL | Status: AC
  Administered 2016-02-13: 30 mL via ORAL
  Filled 2016-02-13: qty 30

## 2016-02-13 MED ORDER — IOHEXOL 300 MG/ML  SOLN
80.0000 mL | Freq: Once | INTRAMUSCULAR | Status: AC | PRN
  Administered 2016-02-13: 100 mL via INTRAVENOUS

## 2016-02-13 MED ORDER — ONDANSETRON HCL 4 MG/2ML IJ SOLN
4.0000 mg | Freq: Once | INTRAMUSCULAR | Status: AC
  Administered 2016-02-13: 4 mg via INTRAVENOUS
  Filled 2016-02-13: qty 2

## 2016-02-13 MED ORDER — ONDANSETRON HCL 4 MG PO TABS: 4.0000 mg | ORAL_TABLET | Freq: Four times a day (QID) | ORAL | Status: DC

## 2016-02-13 NOTE — ED Notes (Signed)
Pt reports onset yesterday of abd pain and n/v/d. Having dark watery stools.

## 2016-02-13 NOTE — ED Notes (Signed)
Pt found eating egg sandwich by RN, no nausea/vomiting since. Passed PO challenge

## 2016-02-13 NOTE — ED Provider Notes (Signed)
CSN: [id number]     Arrival date & time 02/13/16  1327 History   First MD Initiated Contact with Patient 02/13/16 1753     Chief Complaint  Patient presents with  . Abdominal Pain  . Diarrhea  . Emesis   Patient is a 75 y.o. male presenting with abdominal pain. The history is provided by the patient and a relative. No language interpreter was used.  Abdominal Pain Pain location:  LLQ Pain quality: aching   Pain radiates to:  Does not radiate Pain severity:  Moderate Onset quality:  Gradual Duration:  2 days Timing:  Intermittent Progression:  Worsening Chronicity:  New Context: sick contacts   Context: not alcohol use, not diet changes and not previous surgeries   Relieved by:  None tried Worsened by:  Nothing tried Ineffective treatments:  None tried Associated symptoms: chills, diarrhea, nausea and vomiting   Associated symptoms: no chest pain, no constipation, no cough, no fever, no hematuria, no shortness of breath and no sore throat     Past Medical History  Diagnosis Date  . Esophageal reflux   . Allergic rhinitis   . Colon adenoma   . Frequent headaches   . OA (osteoarthritis)   . Lateral epicondylitis   . Hyperglyceridemia   . ED (erectile dysfunction)   . Biceps tendon rupture   . Plantar fasciitis   . C6 radiculopathy   . Lumbar radiculopathy    Past Surgical History  Procedure Laterality Date  . Appendectomy    . Lumbar laminectomy    . Colonoscopy     Family History  Problem Relation Age of Onset  . COPD Father   . Congestive Heart Failure Father   . CAD Father   . Pancreatic cancer Mother   . GER disease Mother   . Anxiety disorder Mother    Social History  Substance Use Topics  . Smoking status: Never Smoker   . Smokeless tobacco: None  . Alcohol Use: No    Review of Systems  Constitutional: Positive for chills. Negative for fever, activity change and appetite change.  HENT: Negative for congestion, dental problem, ear pain, facial  swelling, hearing loss, rhinorrhea, sneezing, sore throat, trouble swallowing and voice change.   Eyes: Negative for photophobia, pain, redness and visual disturbance.  Respiratory: Negative for apnea, cough, chest tightness, shortness of breath, wheezing and stridor.   Cardiovascular: Negative for chest pain, palpitations and leg swelling.  Gastrointestinal: Positive for nausea, vomiting, abdominal pain and diarrhea. Negative for constipation, blood in stool and abdominal distention.  Endocrine: Negative for polydipsia and polyuria.  Genitourinary: Negative for frequency, hematuria, flank pain, decreased urine volume and difficulty urinating.  Musculoskeletal: Negative for back pain, joint swelling, gait problem, neck pain and neck stiffness.  Skin: Negative for rash and wound.  Allergic/Immunologic: Negative for immunocompromised state.  Neurological: Negative for dizziness, syncope, facial asymmetry, speech difficulty, weakness, light-headedness, numbness and headaches.  Hematological: Negative for adenopathy.  Psychiatric/Behavioral: Negative for suicidal ideas, behavioral problems, confusion, sleep disturbance and agitation. The patient is not nervous/anxious.   All other systems reviewed and are negative.    Allergies  Review of patient's allergies indicates no known allergies.  Home Medications   Prior to Admission medications   Medication Sig Start Date End Date Taking? Authorizing Provider  Cyanocobalamin (VITAMIN B-12 PO) Take 1 tablet by mouth daily.   Yes Historical Provider, MD  diphenhydramine-acetaminophen (TYLENOL PM) 25-500 MG TABS tablet Take 1 tablet by mouth at bedtime.  Yes Historical Provider, MD  fluticasone (FLONASE) 50 MCG/ACT nasal spray Place into both nostrils daily.   Yes Historical Provider, MD  oxymetazoline (AFRIN) 0.05 % nasal spray Place 1 spray into both nostrils 2 (two) times daily as needed for congestion.   Yes Historical Provider, MD  ranitidine  (ZANTAC) 150 MG capsule Take 150 mg by mouth 2 (two) times daily.   Yes Historical Provider, MD  ondansetron (ZOFRAN) 4 MG tablet Take 1 tablet (4 mg total) by mouth every 6 (six) hours. 02/13/16   Vira Blanco, MD   BP 104/81 mmHg  Pulse 87  Temp(Src) 98.2 F (36.8 C) (Oral)  Resp 20  Ht 5\' 9"  (1.753 m)  Wt 90.719 kg  BMI 29.52 kg/m2  SpO2 96% Physical Exam  Constitutional: He is oriented to person, place, and time. He appears well-developed and well-nourished. No distress.  HENT:  Head: Normocephalic and atraumatic.  Right Ear: External ear normal.  Left Ear: External ear normal.  Eyes: Pupils are equal, round, and reactive to light. Right eye exhibits no discharge. Left eye exhibits no discharge.  Neck: Normal range of motion. No JVD present. No tracheal deviation present.  Cardiovascular: Normal rate, regular rhythm and normal heart sounds.  Exam reveals no friction rub.   No murmur heard. Pulmonary/Chest: Effort normal and breath sounds normal. No stridor. No respiratory distress. He has no wheezes.  Abdominal: Soft. Bowel sounds are normal. He exhibits no distension. There is tenderness. There is no rebound and no guarding.  Musculoskeletal: Normal range of motion. He exhibits no edema or tenderness.  Lymphadenopathy:    He has no cervical adenopathy.  Neurological: He is alert and oriented to person, place, and time. No cranial nerve deficit. Coordination normal.  Skin: Skin is warm and dry. No rash noted. No pallor.  Psychiatric: He has a normal mood and affect. His behavior is normal. Judgment and thought content normal.  Nursing note and vitals reviewed.   ED Course  Procedures (including critical care time) Labs Review Labs Reviewed  COMPREHENSIVE METABOLIC PANEL - Abnormal; Notable for the following:    Glucose, Bld 140 (*)    Creatinine, Ser 1.38 (*)    ALT 15 (*)    GFR calc non Af Amer 48 (*)    GFR calc Af Amer 56 (*)    All other components within normal  limits  CBC - Abnormal; Notable for the following:    WBC 11.9 (*)    MCV 103.8 (*)    MCH 34.8 (*)    All other components within normal limits  URINALYSIS, ROUTINE W REFLEX MICROSCOPIC (NOT AT [facility]) - Abnormal; Notable for the following:    Color, Urine AMBER (*)    APPearance CLOUDY (*)    Specific Gravity, Urine 1.037 (*)    Bilirubin Urine SMALL (*)    Ketones, ur 15 (*)    Protein, ur 30 (*)    All other components within normal limits  URINE MICROSCOPIC-ADD ON - Abnormal; Notable for the following:    Squamous Epithelial / LPF 0-5 (*)    Bacteria, UA RARE (*)    Crystals CA OXALATE CRYSTALS (*)    All other components within normal limits  LIPASE, BLOOD  I-STAT CG4 LACTIC ACID, ED  I-STAT TROPOININ, ED  I-STAT CG4 LACTIC ACID, ED    Imaging Review Dg Chest 2 View  02/13/2016  CLINICAL DATA:  Patient with nausea vomiting and diarrhea for 24 hours. EXAM: CHEST  2 VIEW  COMPARISON:  Chest radiograph 03/29/2015. FINDINGS: Stable cardiac and mediastinal contours. No consolidative pulmonary opacities. No pleural effusion or pneumothorax. Thoracic spine degenerative changes. IMPRESSION: No acute cardiopulmonary process. Electronically Signed   By: Lovey Newcomer M.D.   On: 02/13/2016 19:06   Ct Abdomen Pelvis W Contrast  02/13/2016  CLINICAL DATA:  75 year old male with history of left lower quadrant abdominal pain. EXAM: CT ABDOMEN AND PELVIS WITH CONTRAST TECHNIQUE: Multidetector CT imaging of the abdomen and pelvis was performed using the standard protocol following bolus administration of intravenous contrast. CONTRAST:  148mL OMNIPAQUE IOHEXOL 300 MG/ML  SOLN COMPARISON:  No priors. FINDINGS: Lower chest: Mild scarring in the lower lobes of the lungs bilaterally. Hepatobiliary: No cystic or solid hepatic lesions. No intra or extrahepatic biliary ductal dilatation. Gallbladder is normal in appearance. Pancreas: No pancreatic mass. No pancreatic ductal dilatation. No pancreatic or  peripancreatic fluid or inflammatory changes. Spleen: Unremarkable. Adrenals/Urinary Tract: Bilateral adrenal glands and bilateral kidneys are normal in appearance. No hydroureteronephrosis. Urinary bladder is normal in appearance. Stomach/Bowel: Normal appearance of the stomach. No pathologic dilatation of small bowel or colon. Numerous colonic diverticulae are noted, particularly in the distal descending colon and proximal sigmoid colon. There are no surrounding inflammatory changes to suggest an acute diverticulitis at this time. Normal appendix. Vascular/Lymphatic: Atherosclerosis throughout the abdominal and pelvic vasculature, without evidence of aneurysm or dissection. No lymphadenopathy noted in the abdomen or pelvis. Reproductive: Prostate gland and seminal vesicles are unremarkable in appearance. Other: No significant volume of ascites.  No pneumoperitoneum. Musculoskeletal: There are no aggressive appearing lytic or blastic lesions noted in the visualized portions of the skeleton. Bilateral pars defects at L4 with 1 cm of anterolisthesis of L4 upon L5. IMPRESSION: 1. No acute findings in the abdomen or pelvis to account for the patient's symptoms. 2. The patient does have colonic diverticulosis, most severe in the distal descending colon and proximal sigmoid colon, however, at this time, there are no imaging findings to suggest an acute diverticulitis. 3. Normal appendix. 4. Atherosclerosis. 5. 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 Electronically Signed   By: Vinnie Langton M.D.   On: 02/13/2016 19:59   I have personally reviewed and evaluated these images and lab results as part of my medical decision-making.   EKG Interpretation   Date/Time:  Monday February 13 2016 22:47:56 EST Ventricular Rate:  81 PR Interval:  197 QRS Duration: 86 QT Interval:  354 QTC Calculation: 411 R Axis:   -43 Text Interpretation:  Sinus rhythm Probable left atrial enlargement Left  axis deviation Abnormal  R-wave progression, late transition No significant  change since last tracing Confirmed by KNOTT MD, DANIEL [id number]) on  02/13/2016 11:00:38 PM      MDM   Final diagnoses:  Viral gastroenteritis    Patient with 1 day of abdominal pain, nausea, vomiting, diarrhea. Patient witha past medical history. He has sick contacts with similar symptoms.  On exam patient afebrile. And normal heart rate normal blood pressure. He overall appears well. He has tenderness left lower quadrant but no rebound or guarding.  Differential diagnosis includes diverticulitis versus gastroenteritis. Lactate normal. Patient also with burning epigastric pain following vomiting. EKG with no ischemic changes, troponin negative. Do not suspect ACS. This likely secondary to vomiting. CT scan with no acute findings of diverticulitis.  Patient with hiccups in ED. Chest x-ray with no acute findings. Patient given Zofran, GI cocktail, Reglan Benadryl with improvement in symptoms. Patient encouraged to follow-up with primary care  physician in the next several days. He was given Zofran prescription at home and is able tolerate by mouth intake prior to discharge.  Viral gastroenteritis. Patient Cam Hai no acute distress at time of discharge.  Discussed case my attending, Dr. Laneta Simmers.    Vira Blanco, MD 02/13/16 2329  Leo Grosser, MD 02/14/16 [phone]

## 2016-02-13 NOTE — Discharge Instructions (Signed)

## 2016-02-13 NOTE — ED Notes (Signed)
Pt observed hiccupping until he is discomfort.

## 2016-02-15 DIAGNOSIS — K5730 Diverticulosis of large intestine without perforation or abscess without bleeding: Secondary | ICD-10-CM | POA: Diagnosis not present

## 2016-02-15 DIAGNOSIS — K529 Noninfective gastroenteritis and colitis, unspecified: Secondary | ICD-10-CM | POA: Diagnosis not present

## 2016-02-15 DIAGNOSIS — D72829 Elevated white blood cell count, unspecified: Secondary | ICD-10-CM | POA: Diagnosis not present

## 2016-02-15 DIAGNOSIS — K219 Gastro-esophageal reflux disease without esophagitis: Secondary | ICD-10-CM | POA: Diagnosis not present

## 2016-02-15 DIAGNOSIS — R7309 Other abnormal glucose: Secondary | ICD-10-CM | POA: Diagnosis not present

## 2016-02-20 DIAGNOSIS — R739 Hyperglycemia, unspecified: Secondary | ICD-10-CM | POA: Diagnosis not present

## 2016-02-20 DIAGNOSIS — K219 Gastro-esophageal reflux disease without esophagitis: Secondary | ICD-10-CM | POA: Diagnosis not present

## 2016-11-26 DIAGNOSIS — R7303 Prediabetes: Secondary | ICD-10-CM | POA: Insufficient documentation

## 2016-11-26 DIAGNOSIS — N529 Male erectile dysfunction, unspecified: Secondary | ICD-10-CM | POA: Insufficient documentation

## 2017-01-16 ENCOUNTER — Other Ambulatory Visit: Payer: Self-pay | Admitting: Gastroenterology

## 2017-02-28 ENCOUNTER — Encounter (HOSPITAL_COMMUNITY): Payer: Self-pay | Admitting: *Deleted

## 2017-03-05 ENCOUNTER — Encounter (HOSPITAL_COMMUNITY)
Admission: RE | Disposition: A | Discharge status: Home / Self Care | Payer: Self-pay | Source: Ambulatory Visit | Attending: Gastroenterology

## 2017-03-05 ENCOUNTER — Ambulatory Visit (HOSPITAL_COMMUNITY): Payer: Medicare HMO | Admitting: Anesthesiology

## 2017-03-05 ENCOUNTER — Encounter (HOSPITAL_COMMUNITY): Payer: Self-pay

## 2017-03-05 ENCOUNTER — Ambulatory Visit (HOSPITAL_COMMUNITY)
Admission: RE | Admit: 2017-03-05 | Discharge: 2017-03-05 | Disposition: A | Discharge status: Home / Self Care | Payer: Medicare HMO | Source: Ambulatory Visit | Attending: Gastroenterology | Admitting: Gastroenterology

## 2017-03-05 DIAGNOSIS — D122 Benign neoplasm of ascending colon: Secondary | ICD-10-CM | POA: Diagnosis not present

## 2017-03-05 DIAGNOSIS — E781 Pure hyperglyceridemia: Secondary | ICD-10-CM | POA: Diagnosis not present

## 2017-03-05 DIAGNOSIS — K219 Gastro-esophageal reflux disease without esophagitis: Secondary | ICD-10-CM | POA: Insufficient documentation

## 2017-03-05 DIAGNOSIS — Z86010 Personal history of colonic polyps: Secondary | ICD-10-CM | POA: Insufficient documentation

## 2017-03-05 DIAGNOSIS — Z1211 Encounter for screening for malignant neoplasm of colon: Secondary | ICD-10-CM | POA: Insufficient documentation

## 2017-03-05 DIAGNOSIS — J309 Allergic rhinitis, unspecified: Secondary | ICD-10-CM | POA: Insufficient documentation

## 2017-03-05 DIAGNOSIS — M1990 Unspecified osteoarthritis, unspecified site: Secondary | ICD-10-CM | POA: Diagnosis not present

## 2017-03-05 HISTORY — PX: COLONOSCOPY WITH PROPOFOL: SHX5780

## 2017-03-05 SURGERY — COLONOSCOPY WITH PROPOFOL
Anesthesia: Monitor Anesthesia Care

## 2017-03-05 MED ORDER — SODIUM CHLORIDE 0.9 % IV SOLN: INTRAVENOUS | Status: DC

## 2017-03-05 MED ORDER — PROPOFOL 500 MG/50ML IV EMUL
INTRAVENOUS | Status: DC | PRN
  Administered 2017-03-05: 50 mg via INTRAVENOUS

## 2017-03-05 MED ORDER — EPHEDRINE 5 MG/ML INJ
INTRAVENOUS | Status: AC
  Filled 2017-03-05: qty 10

## 2017-03-05 MED ORDER — LACTATED RINGERS IV SOLN
INTRAVENOUS | Status: DC
  Administered 2017-03-05 (×2): via INTRAVENOUS

## 2017-03-05 MED ORDER — EPHEDRINE SULFATE 50 MG/ML IJ SOLN
INTRAMUSCULAR | Status: DC | PRN
  Administered 2017-03-05: 10 mg via INTRAVENOUS

## 2017-03-05 MED ORDER — PROPOFOL 10 MG/ML IV BOLUS
INTRAVENOUS | Status: AC
  Filled 2017-03-05: qty 40

## 2017-03-05 MED ORDER — GLYCOPYRROLATE 0.2 MG/ML IJ SOLN
INTRAMUSCULAR | Status: DC | PRN
  Administered 2017-03-05: 0.2 mg via INTRAVENOUS

## 2017-03-05 MED ORDER — PROPOFOL 500 MG/50ML IV EMUL
INTRAVENOUS | Status: DC | PRN
  Administered 2017-03-05: 125 ug/kg/min via INTRAVENOUS

## 2017-03-05 MED ORDER — GLYCOPYRROLATE 0.2 MG/ML IV SOSY
PREFILLED_SYRINGE | INTRAVENOUS | Status: AC
  Filled 2017-03-05: qty 5

## 2017-03-05 SURGICAL SUPPLY — 22 items

## 2017-03-05 NOTE — Op Note (Addendum)
[facility] - Closed Patient Name: Kristopher [name] Procedure Date: 03/05/2017 MRN: [id number] Attending MD: Garlan Fair , MD Date of Birth: 1941-10-25 CSN: [id number] Age: 76 Admit Type: Outpatient Procedure:                Colonoscopy Indications:              High risk colon cancer surveillance: Personal                            history of non-advanced adenoma Providers:                Garlan Fair, MD, Burtis Junes, RN, William Dalton, Technician Referring MD:              Medicines:                Propofol per Anesthesia Complications:            No immediate complications. Estimated Blood Loss:     Estimated blood loss was minimal. Procedure:                Pre-Anesthesia Assessment:                           - Prior to the procedure, a History and Physical                            was performed, and patient medications and                            allergies were reviewed. The patient's tolerance of                            previous anesthesia was also reviewed. The risks                            and benefits of the procedure and the sedation                            options and risks were discussed with the patient.                            All questions were answered, and informed consent                            was obtained. Prior Anticoagulants: The patient has                            taken no previous anticoagulant or antiplatelet                            agents. ASA Grade Assessment: II - A patient with  mild systemic disease. After reviewing the risks                            and benefits, the patient was deemed in                            satisfactory condition to undergo the procedure.                           After obtaining informed consent, the colonoscope                            was passed under direct vision. Throughout the                            procedure, the  patient's blood pressure, pulse, and                            oxygen saturations were monitored continuously. The                            EC-[id number] ([id number]) scope was introduced through                            the anus and advanced to the the cecum, identified                            by appendiceal orifice and ileocecal valve. The                            colonoscopy was performed without difficulty. The                            patient tolerated the procedure well. The quality                            of the bowel preparation was good. The ileocecal                            valve, the appendiceal orifice and the rectum were                            photographed. Scope In: 9:37:54 AM Scope Out: 10:00:42 AM Scope Withdrawal Time: 0 hours 11 minutes 49 seconds  Total Procedure Duration: 0 hours 22 minutes 48 seconds  Findings:      The perianal and digital rectal examinations were normal.      A 4 mm polyp was found in the mid ascending colon. The polyp was       sessile. The polyp was removed with a cold snare. Resection and       retrieval were complete.      The exam was otherwise without abnormality. Impression:               - One 4 mm polyp in the mid ascending colon,  removed with a cold snare. Resected and retrieved.                           - The examination was otherwise normal. Moderate Sedation:      N/A- Per Anesthesia Care Recommendation:           - Patient has a contact number available for                            emergencies. The signs and symptoms of potential                            delayed complications were discussed with the                            patient. Return to normal activities tomorrow.                            Written discharge instructions were provided to the                            patient.                           - Repeat colonoscopy is not recommended for                             surveillance.                           - Resume previous diet.                           - Continue present medications. Procedure Code(s):        --- Professional ---                           [phone], Colonoscopy, flexible; with removal of                            tumor(s), polyp(s), or other lesion(s) by snare                            technique Diagnosis Code(s):        --- Professional ---                           Z86.010, Personal history of colonic polyps                           D12.2, Benign neoplasm of ascending colon CPT copyright 2016 American Medical Association. All rights reserved. The codes documented in this report are preliminary and upon coder review may  be revised to meet current compliance requirements. Earle Gell, MD Garlan Fair, MD 03/05/2017 10:02:36 AM This report has been signed electronically. Number of Addenda: 0

## 2017-03-05 NOTE — Discharge Instructions (Signed)

## 2017-03-05 NOTE — Anesthesia Postprocedure Evaluation (Signed)
Anesthesia Post Note  Patient: Kristopher [name]  Procedure(s) Performed: Procedure(s) (LRB): COLONOSCOPY WITH PROPOFOL (N/A)  Patient location during evaluation: PACU Anesthesia Type: MAC Level of consciousness: awake and alert Pain management: pain level controlled Vital Signs Assessment: post-procedure vital signs reviewed and stable Respiratory status: spontaneous breathing, nonlabored ventilation, respiratory function stable and patient connected to nasal cannula oxygen Cardiovascular status: stable and blood pressure returned to baseline Anesthetic complications: no       Last Vitals:  Vitals:   03/05/17 1010 03/05/17 1028  BP: (!) 104/56 135/75  Pulse: 72   Resp: 18   Temp:      Last Pain:  Vitals:   03/05/17 0855  TempSrc: Oral                 Wm Sahagun EDWARD

## 2017-03-05 NOTE — H&P (Signed)
Procedure: Surveillance colonoscopy. 06/24/2012 colonoscopy was performed with removal of a 5 mm tubular adenomatous transverse colon polyp  History: The patient is a 76 year old male born Oct 20, 1941. He is scheduled to undergo a repeat surveillance colonoscopy today.  Past medical history: Tonsillectomy. Lumbar laminectomy. Allergic rhinitis. Gastroesophageal reflux. Small vocal cord nodules. Osteoarthritis. Hypertriglyceridemia. Ruptured biceps tendon.  Exam: The patient is alert and lying comfortably on the endoscopy stretcher. Abdomen is soft and nontender to palpation. Lungs are clear to auscultation. Cardiac exam reveals a regular rhythm.  Plan: Proceed with surveillance colonoscopy

## 2017-03-05 NOTE — Transfer of Care (Signed)
Immediate Anesthesia Transfer of Care Note  Patient: Kristopher [name]  Procedure(s) Performed: Procedure(s): COLONOSCOPY WITH PROPOFOL (N/A)  Patient Location: PACU  Anesthesia Type:MAC  Level of Consciousness:  sedated, patient cooperative and responds to stimulation  Airway & Oxygen Therapy:Patient Spontanous Breathing and Patient connected to face mask oxgen  Post-op Assessment:  Report given to PACU RN and Post -op Vital signs reviewed and stable  Post vital signs:  Reviewed and stable  Last Vitals:  Vitals:   03/05/17 0855 03/05/17 1001  BP: (!) 141/80   Pulse: 67 70  Resp: 18 15  Temp: 40.8 C     Complications: No apparent anesthesia complications

## 2017-03-05 NOTE — Anesthesia Preprocedure Evaluation (Signed)
Anesthesia Evaluation  Patient identified by MRN, date of birth, ID band Patient awake    Reviewed: Allergy & Precautions, H&P , Patient's Chart, lab work & pertinent test results, reviewed documented beta blocker date and time   Airway Mallampati: II  TM Distance: >3 FB Neck ROM: full    Dental no notable dental hx.    Pulmonary    Pulmonary exam normal breath sounds clear to auscultation       Cardiovascular  Rhythm:regular Rate:Normal     Neuro/Psych    GI/Hepatic   Endo/Other    Renal/GU      Musculoskeletal   Abdominal   Peds  Hematology   Anesthesia Other Findings   Reproductive/Obstetrics                             Anesthesia Physical Anesthesia Plan  ASA: II  Anesthesia Plan: MAC   Post-op Pain Management:    Induction: Intravenous  Airway Management Planned: Mask and Natural Airway  Additional Equipment:   Intra-op Plan:   Post-operative Plan:   Informed Consent: I have reviewed the patients History and Physical, chart, labs and discussed the procedure including the risks, benefits and alternatives for the proposed anesthesia with the patient or authorized representative who has indicated his/her understanding and acceptance.   Dental Advisory Given  Plan Discussed with: CRNA and Surgeon  Anesthesia Plan Comments:         Anesthesia Quick Evaluation  

## 2017-03-06 ENCOUNTER — Encounter (HOSPITAL_COMMUNITY): Payer: Self-pay | Admitting: Gastroenterology

## 2017-03-26 ENCOUNTER — Encounter: Payer: Self-pay | Admitting: Pediatrics

## 2017-03-26 ENCOUNTER — Ambulatory Visit (INDEPENDENT_AMBULATORY_CARE_PROVIDER_SITE_OTHER): Payer: Medicare HMO | Admitting: Pediatrics

## 2017-03-26 VITALS — BP 110/68 | HR 67 | Temp 97.9°F | Resp 16 | Ht 68.0 in | Wt 201.6 lb

## 2017-03-26 DIAGNOSIS — J3089 Other allergic rhinitis: Secondary | ICD-10-CM | POA: Insufficient documentation

## 2017-03-26 DIAGNOSIS — Z91038 Other insect allergy status: Secondary | ICD-10-CM | POA: Diagnosis not present

## 2017-03-26 DIAGNOSIS — K219 Gastro-esophageal reflux disease without esophagitis: Secondary | ICD-10-CM

## 2017-03-26 MED ORDER — EPINEPHRINE 0.3 MG/0.3ML IJ SOAJ: INTRAMUSCULAR | 2 refills | Status: DC

## 2017-03-26 NOTE — Progress Notes (Signed)
[place] [address] Dept: [phone]  New Patient Note  Patient ID: Kristopher [name], male    DOB: 01-20-1941  Age: 76 y.o. MRN: [id number] Date of Office Visit: 03/26/2017 Referring provider: Lilian Coma, MD [address] [place], [place] [address]    Chief Complaint: Allergic Reaction (bit by fire ant broke out in hives all over x 1 week ago ) and Nasal Congestion  HPI Suyash Amory presents for evaluation of an allergic reaction to ants  on 03/19/2017 . He had about 2 bites in his hands and began itching within minutes. Then he developed hives which progressed and he was seen in urgent care center where he was given epinephrine, Benadryl and prednisone. He finished prednisone 2 days ago. He did not have any other allergic symptoms. He also has a history of sinus problems for many years aggravated by exposure to dust, cigarette smoke and hay. He has been using Afrin nasal spray on a daily basis for many years He does snore at night. He has never had asthma, eczema or hives. His father died from an insect sting  Review of Systems  Constitutional: Negative.   HENT:       Nasal congestion for several years aggravated by exposure to dust , smoke and hay . Daily use of Afrin nasal spray for many years  Eyes: Negative.   Respiratory: Negative.   Cardiovascular: Negative.   Gastrointestinal:       Gastroesophageal reflux  Genitourinary: Negative.   Musculoskeletal:       He had back surgery years ago  Skin:       Hives from ant bites last week  Neurological: Negative.   Endo/Heme/Allergies:       No diabetes or thyroid disease  Psychiatric/Behavioral: Negative.     Outpatient Encounter Prescriptions as of 03/26/2017  Medication Sig  . Artificial Tear Solution (SOOTHE XP OP) Apply 1 drop to eye daily as needed (dry eyes).  . Cyanocobalamin (VITAMIN B-12 PO) Take 1 tablet by mouth daily.  . diphenhydramine-acetaminophen (TYLENOL PM) 25-500 MG TABS  tablet Take 2 tablets by mouth at bedtime.   . fluticasone (FLONASE) 50 MCG/ACT nasal spray Place 2 sprays into both nostrils at bedtime.   . naproxen sodium (ANAPROX) 220 MG tablet Take 440 mg by mouth daily as needed (headaches).  Marland Kitchen oxymetazoline (AFRIN) 0.05 % nasal spray Place 1 spray into both nostrils at bedtime.   . ranitidine (ZANTAC) 300 MG tablet Take 300 mg by mouth daily.  Marland Kitchen EPINEPHrine (EPIPEN 2-PAK) 0.3 mg/0.3 mL IJ SOAJ injection Use as directed for severe allergic reactions   No facility-administered encounter medications on file as of 03/26/2017.      Drug Allergies:  No Known Allergies  Family History: Daimen's family history includes Anxiety disorder in his mother; CAD in his father; COPD in his father; Congestive Heart Failure in his father; GER disease in his mother; Pancreatic cancer in his mother.. Family history is negative for asthma, hayfever, sinus problems, angioedema, eczema, hives, food allergies. His father died from an insect sting.  Social and environmental. There are no pets in the home. He is not exposed to cigarette smoke. He has never smoked cigarettes. He is retired.  Physical Exam: BP 110/68   Pulse 67   Temp 97.9 F (36.6 C) (Oral)   Resp 16   Ht 5\' 8"  (1.727 m)   Wt 201 lb 9.6 oz (91.4 kg)  SpO2 97%   BMI 30.65 kg/m    Physical Exam  Constitutional: He is oriented to person, place, and time. He appears well-developed and well-nourished.  HENT:  Eyes normal. Ears normal. Nose moderate swelling of nasal turbinates with clear nasal discharge. Pharynx normal.  Neck: Neck supple. No thyromegaly present.  Cardiovascular:  S1 and S2 normal no murmurs  Pulmonary/Chest:  Clear to percussion and auscultation  Abdominal: Soft. There is no tenderness (no hepatosplenomeg).  Lymphadenopathy:    He has no cervical adenopathy.  Neurological: He is alert and oriented to person, place, and time.  Skin:  Clear  Psychiatric: He has a normal mood and  affect. His behavior is normal. Judgment and thought content normal.  Vitals reviewed.   Diagnostics: Allergy skin tests show some reactivity to molds and dust mite   Assessment  Assessment and Plan: 1. Other allergic rhinitis   2. 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 Gastroesophageal reflux disease without esophagitis     Meds ordered this encounter  Medications  . EPINEPHrine (EPIPEN 2-PAK) 0.3 mg/0.3 mL IJ SOAJ injection    Sig: Use as directed for severe allergic reactions    Dispense:  2 Device    Refill:  2    Bin: [id number] rxpcn: [id number] issuer: [id number] group: [id number] id: [id number]    Patient Instructions  Environmental control of dust, mite and mold Zyrtec 10 mg once a day for runny nose or itching Fluticasone 2 sprays per nostril at night for stuffy nose Add prednisone 20 mg twice a day for 3 days, 20 mg on the fourth day, 10 mg on the fifth day to try to stop the use of Afrin Stop Afrin nasal spray  If you have an allergic reaction take Benadryl 50 mg every 4 hours and if you have life-threatening symptoms inject with EpiPen 0.3 mg.  I will see him in follow-up in 3 weeks to do testing to fire ants   Return in about 3 weeks (around 04/16/2017).   Thank you for the opportunity to care for this patient.  Please do not hesitate to contact me with questions.  Penne Lash, M.D.  [facility] [address] [place], [place] [address] [phone]

## 2017-03-26 NOTE — Patient Instructions (Addendum)
Environmental control of dust, mite and mold Zyrtec 10 mg once a day for runny nose or itching Fluticasone 2 sprays per nostril at night for stuffy nose Add prednisone 20 mg twice a day for 3 days, 20 mg on the fourth day, 10 mg on the fifth day to try to stop the use of Afrin Stop Afrin nasal spray  If you have an allergic reaction take Benadryl 50 mg every 4 hours and if you have life-threatening symptoms inject with EpiPen 0.3 mg.  I will see him in follow-up in 3 weeks to do testing to fire ants

## 2017-03-27 DIAGNOSIS — K219 Gastro-esophageal reflux disease without esophagitis: Secondary | ICD-10-CM | POA: Insufficient documentation

## 2017-04-16 ENCOUNTER — Ambulatory Visit: Payer: Medicare HMO | Admitting: Pediatrics

## 2017-04-16 ENCOUNTER — Encounter: Payer: Self-pay | Admitting: Pediatrics

## 2017-04-16 VITALS — BP 110/64 | HR 64 | Temp 98.0°F | Resp 16 | Ht 68.0 in | Wt 199.0 lb

## 2017-04-29 ENCOUNTER — Other Ambulatory Visit: Payer: Medicare HMO | Admitting: Pediatrics

## 2017-04-30 NOTE — Progress Notes (Signed)
Patient came in for testing to fire ant. He had not stopped his antihistamines before the testing. No testing done. No charges

## 2017-05-27 ENCOUNTER — Other Ambulatory Visit: Payer: Medicare HMO | Admitting: Pediatrics

## 2017-05-27 ENCOUNTER — Ambulatory Visit (INDEPENDENT_AMBULATORY_CARE_PROVIDER_SITE_OTHER): Payer: Medicare HMO | Admitting: Pediatrics

## 2017-05-27 ENCOUNTER — Encounter: Payer: Self-pay | Admitting: Pediatrics

## 2017-05-27 VITALS — BP 110/68 | HR 65 | Temp 97.5°F | Resp 16

## 2017-05-27 DIAGNOSIS — T63421D Toxic effect of venom of ants, accidental (unintentional), subsequent encounter: Secondary | ICD-10-CM | POA: Diagnosis not present

## 2017-05-27 DIAGNOSIS — J3089 Other allergic rhinitis: Secondary | ICD-10-CM

## 2017-05-27 DIAGNOSIS — T63481D Toxic effect of venom of other arthropod, accidental (unintentional), subsequent encounter: Secondary | ICD-10-CM

## 2017-05-27 DIAGNOSIS — T63421A Toxic effect of venom of ants, accidental (unintentional), initial encounter: Secondary | ICD-10-CM | POA: Insufficient documentation

## 2017-05-27 NOTE — Patient Instructions (Addendum)
If he has an allergic reaction he will take Benadryl 50 mg every 4 hours and if he has life-threatening symptoms he  will inject with EpiPen 0.3 mg I recommend that he be treated with fire ant venom. He also knows that there  are brown ants in the area that can cause systemic allergic reactions. There is no treatment available for the brown ants Zyrtec 10 mg once a day if needed for runny nose or itching Fluticasone 2 sprays per nostril at night for stuffy nose  He has never had a systemic reaction from a Hymenoptera sting. His father died from an insect sting. He will have a serum IgE to Hymenoptera venoms

## 2017-05-27 NOTE — Progress Notes (Signed)
  [place] [address] Dept: [phone]  FOLLOW UP NOTE  Patient ID: Kristopher [name], male    DOB: Mar 08, 1941  Age: 76 y.o. MRN: [id number] Date of Office Visit: 05/27/2017  Assessment  Chief Complaint: Allergic Reaction  HPI Kristopher [name] presents for skin testing to fire ant to determine if he is allergic to fire ant bites . He had an anaphylactic reaction to an ant bite in April of this year. His nasal symptoms are under control with the use of fluticasone 2 sprays per nostril at night   Drug Allergies:  No Known Allergies  Physical Exam: BP 110/68   Pulse 65   Temp 97.5 F (36.4 C) (Oral)   Resp 16   SpO2 94%    Physical Exam  Constitutional: He is oriented to person, place, and time. He appears well-developed and well-nourished.  HENT:  Eyes normal. Ears normal. Nose normal. Pharynx normal.  Neck: Neck supple.  Cardiovascular:  S1 and S2 normal no murmurs  Pulmonary/Chest:  Clear to percussion and  auscultation  Lymphadenopathy:    He has no cervical adenopathy.  Neurological: He is alert and oriented to person, place, and time.  Skin:  Clear  Psychiatric: He has a normal mood and affect. His behavior is normal. Judgment and thought content normal.  Vitals reviewed.   Diagnostics:  Allergy skin testing to fire ant venom  was positive at a 12-998 concentration  Assessment and Plan: 1. Toxic effect of venom of ants, unintentional, subsequent encounter   2. Other allergic rhinitis   3. Allergic reaction to insect sting, accidental or unintentional, subsequent encounter        Patient Instructions  If he has an allergic reaction he will take Benadryl 50 mg every 4 hours and if he has life-threatening symptoms he  will inject with EpiPen 0.3 mg I recommend that he be treated with fire ant venom. He also knows that there  are brown ants in the area that can cause systemic allergic reactions. There is no treatment available for the brown  ants Zyrtec 10 mg once a day if needed for runny nose or itching Fluticasone 2 sprays per nostril at night for stuffy nose  He has never had a systemic reaction from a Hymenoptera sting. His father died from an insect sting. He will have a serum IgE to Hymenoptera venoms   No Follow-up on file.    Thank you for the opportunity to care for this patient.  Please do not hesitate to contact me with questions.  Penne Lash, M.D.  [facility] [address] [place], [place] [address] [phone]

## 2017-05-30 LAB — ALLERGEN HYMENOPTERA PANEL
ALLERGEN WHITE HORNET: 0.15 kU/L — AB
ALLERGEN YELLOW HORNET: 0.14 kU/L — AB
Paper Wasp IgE: 2.8 kU/L — ABNORMAL HIGH
Yellow Jacket IgE: 0.42 kU/L — ABNORMAL HIGH

## 2017-06-03 ENCOUNTER — Telehealth: Payer: Self-pay | Admitting: *Deleted

## 2017-06-03 NOTE — Telephone Encounter (Signed)
Patient wife called asking for results on his labs for venom

## 2017-06-04 NOTE — Telephone Encounter (Signed)
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  He should carefully avoid flying insects as well as aunts and have access to epinephrine autoinjectors in case of a sting followed by systemic symptoms.  He should follow up with Dr. Shaune Leeks for further discussion regarding venom immunotherapy.

## 2017-06-04 NOTE — Telephone Encounter (Signed)
Dr. Verlin Fester, Hymenoptera labs are resulted in EPIC.  Please advise.

## 2017-06-05 NOTE — Telephone Encounter (Signed)
Informed Patient of Dr Barnett Hatter Note. Informed patient to keep epi-pen and Benadryl with him at all times. Patient had a follow up apointmentt with  Dr. Shaune Leeks on July 13th at 8.30 am.

## 2017-06-05 NOTE — Telephone Encounter (Signed)
Dr. Verlin Fester signed Emergency Action Plan form.  Mailed to patient with copy of AVS from last OV.

## 2017-06-21 ENCOUNTER — Encounter: Payer: Self-pay | Admitting: Pediatrics

## 2017-06-21 ENCOUNTER — Ambulatory Visit (INDEPENDENT_AMBULATORY_CARE_PROVIDER_SITE_OTHER): Payer: Medicare HMO | Admitting: Pediatrics

## 2017-06-21 VITALS — BP 126/76 | HR 60 | Temp 97.5°F | Resp 20

## 2017-06-21 DIAGNOSIS — T63481D Toxic effect of venom of other arthropod, accidental (unintentional), subsequent encounter: Secondary | ICD-10-CM | POA: Diagnosis not present

## 2017-06-21 DIAGNOSIS — J3089 Other allergic rhinitis: Secondary | ICD-10-CM | POA: Diagnosis not present

## 2017-06-21 DIAGNOSIS — T63481A Toxic effect of venom of other arthropod, accidental (unintentional), initial encounter: Secondary | ICD-10-CM | POA: Insufficient documentation

## 2017-06-21 NOTE — Progress Notes (Signed)
  [place] [address] Dept: [phone]  FOLLOW UP NOTE  Patient ID: Kristopher [name], male    DOB: July 27, 1941  Age: 76 y.o. MRN: [id number] Date of Office Visit: 06/21/2017  Assessment  Chief Complaint: Allergic Reaction and Allergic Rhinitis   HPI [name] presents for follow-up of allergic rhinitis and an allergy to fire ant His father died from an insect sting and Farrell is allergic to wasp and less so to hornets and yellow jackets in an Immunocap IgE testing. He has not had an anaphylactic reaction to insect venoms he is having nasal congestion at night. He is allergic to dust mites and some molds.  Current medications are Benadryl and EpiPen 0.3 mg if needed, fluticasone 2 sprays per nostril at night. His other medications are outlined in the chart   Drug Allergies:  Allergies  Allergen Reactions  . Fire Ant     Burning and itching all over  . Wasp Venom     Physical Exam: BP 126/76 (BP Location: Left Arm, Patient Position: Sitting, Cuff Size: Normal)   Pulse 60   Temp (!) 97.5 F (36.4 C) (Oral)   Resp 20   SpO2 96%    Physical Exam  Constitutional: He is oriented to person, place, and time. He appears well-developed and well-nourished.  HENT:  Eyes normal. Ears normal. Nose mild swelling of nasal turbinates. Pharynx normal.  Neck: Neck supple.  Cardiovascular:  S1 and S2 normal no murmurs  Pulmonary/Chest:  Clear to percussion and auscultation  Lymphadenopathy:    He has no cervical adenopathy.  Neurological: He is alert and oriented to person, place, and time.  Psychiatric: He has a normal mood and affect. His behavior is normal. Judgment and thought content normal.  Vitals reviewed.   Diagnostics:  none  Assessment and Plan: 1. Other allergic rhinitis   2. Allergic reaction to insect sting, accidental or unintentional, subsequent encounter   3.     Fire Manufacturing systems engineer allergy     Patient Instructions  Zyrtec 10 mg once a day on a daily  basis to help his nasal congestion and hopefully prevent an allergic reaction from an ant bite or an insect sting Fluticasone 2 sprays per nostril at night for stuffy nose Call me if the  nasal congestion is not better on this treatment plan  If he has an insect sting or  fire ant bite, he will take Benadryl 50 mg every 4 hours and if he has life-threatening symptoms he will inject with EpiPen 0.3 mg At this time he does not want to start on injections to fire ants or insect stings   Return in about 1 year (around 06/21/2018).    Thank you for the opportunity to care for this patient.  Please do not hesitate to contact me with questions.  Penne Lash, M.D.  [facility] [address] [place], [place] [address] [phone]

## 2017-06-21 NOTE — Addendum Note (Signed)
Addendum  created 06/21/17 1350 by Christana Angelica, MD   Sign clinical note    

## 2017-06-21 NOTE — Patient Instructions (Signed)
Zyrtec 10 mg once a day on a daily basis to help his nasal congestion and hopefully prevent an allergic reaction from an ant bite or an insect sting Fluticasone 2 sprays per nostril at night for stuffy nose Call me if the  nasal congestion is not better on this treatment plan  If he has an insect sting or  fire ant bite, he will take Benadryl 50 mg every 4 hours and if he has life-threatening symptoms he will inject with EpiPen 0.3 mg At this time he does not want to start on injections to fire ants or insect stings

## 2017-06-21 NOTE — Anesthesia Postprocedure Evaluation (Signed)
Anesthesia Post Note  Patient: Kristopher [name]  Procedure(s) Performed: Procedure(s) (LRB): COLONOSCOPY WITH PROPOFOL (N/A)     Anesthesia Post Evaluation  Last Vitals:  Vitals:   03/05/17 1010 03/05/17 1028  BP: (!) 104/56 135/75  Pulse: 72   Resp: 18   Temp:      Last Pain:  Vitals:   03/06/17 1346  TempSrc:   PainSc: 3                  Jacquline Terrill EDWARD

## 2017-10-21 ENCOUNTER — Ambulatory Visit (HOSPITAL_COMMUNITY): Payer: Medicare HMO | Attending: Cardiology

## 2017-10-21 ENCOUNTER — Other Ambulatory Visit: Payer: Self-pay | Admitting: Internal Medicine

## 2017-10-21 ENCOUNTER — Other Ambulatory Visit: Payer: Self-pay

## 2017-10-21 DIAGNOSIS — R600 Localized edema: Secondary | ICD-10-CM | POA: Insufficient documentation

## 2017-10-21 DIAGNOSIS — I071 Rheumatic tricuspid insufficiency: Secondary | ICD-10-CM | POA: Insufficient documentation

## 2017-10-21 DIAGNOSIS — R7303 Prediabetes: Secondary | ICD-10-CM | POA: Diagnosis not present

## 2017-12-11 DIAGNOSIS — J189 Pneumonia, unspecified organism: Secondary | ICD-10-CM | POA: Diagnosis not present

## 2017-12-30 DIAGNOSIS — F411 Generalized anxiety disorder: Secondary | ICD-10-CM | POA: Diagnosis not present

## 2017-12-30 DIAGNOSIS — J309 Allergic rhinitis, unspecified: Secondary | ICD-10-CM | POA: Diagnosis not present

## 2017-12-30 DIAGNOSIS — Z0000 Encounter for general adult medical examination without abnormal findings: Secondary | ICD-10-CM | POA: Diagnosis not present

## 2017-12-30 DIAGNOSIS — K219 Gastro-esophageal reflux disease without esophagitis: Secondary | ICD-10-CM | POA: Diagnosis not present

## 2017-12-30 DIAGNOSIS — E782 Mixed hyperlipidemia: Secondary | ICD-10-CM | POA: Diagnosis not present

## 2017-12-30 DIAGNOSIS — R202 Paresthesia of skin: Secondary | ICD-10-CM | POA: Diagnosis not present

## 2017-12-30 DIAGNOSIS — N183 Chronic kidney disease, stage 3 (moderate): Secondary | ICD-10-CM | POA: Diagnosis not present

## 2017-12-30 DIAGNOSIS — Z1389 Encounter for screening for other disorder: Secondary | ICD-10-CM | POA: Diagnosis not present

## 2017-12-30 DIAGNOSIS — M19041 Primary osteoarthritis, right hand: Secondary | ICD-10-CM | POA: Diagnosis not present

## 2018-01-27 DIAGNOSIS — Z125 Encounter for screening for malignant neoplasm of prostate: Secondary | ICD-10-CM | POA: Diagnosis not present

## 2018-01-27 DIAGNOSIS — D72825 Bandemia: Secondary | ICD-10-CM | POA: Diagnosis not present

## 2018-03-12 DIAGNOSIS — M5412 Radiculopathy, cervical region: Secondary | ICD-10-CM | POA: Diagnosis not present

## 2018-03-21 DIAGNOSIS — M47892 Other spondylosis, cervical region: Secondary | ICD-10-CM | POA: Diagnosis not present

## 2018-03-21 DIAGNOSIS — M47812 Spondylosis without myelopathy or radiculopathy, cervical region: Secondary | ICD-10-CM | POA: Diagnosis not present

## 2018-03-21 DIAGNOSIS — M5412 Radiculopathy, cervical region: Secondary | ICD-10-CM | POA: Diagnosis not present

## 2018-03-27 DIAGNOSIS — M5412 Radiculopathy, cervical region: Secondary | ICD-10-CM | POA: Diagnosis not present

## 2018-04-17 DIAGNOSIS — L309 Dermatitis, unspecified: Secondary | ICD-10-CM | POA: Diagnosis not present

## 2018-04-17 DIAGNOSIS — L578 Other skin changes due to chronic exposure to nonionizing radiation: Secondary | ICD-10-CM | POA: Diagnosis not present

## 2018-04-17 DIAGNOSIS — C4442 Squamous cell carcinoma of skin of scalp and neck: Secondary | ICD-10-CM | POA: Diagnosis not present

## 2018-04-17 DIAGNOSIS — D0422 Carcinoma in situ of skin of left ear and external auricular canal: Secondary | ICD-10-CM | POA: Diagnosis not present

## 2018-05-08 DIAGNOSIS — R7303 Prediabetes: Secondary | ICD-10-CM | POA: Diagnosis not present

## 2018-05-08 DIAGNOSIS — D7589 Other specified diseases of blood and blood-forming organs: Secondary | ICD-10-CM | POA: Diagnosis not present

## 2018-05-08 DIAGNOSIS — E785 Hyperlipidemia, unspecified: Secondary | ICD-10-CM | POA: Diagnosis not present

## 2018-05-13 DIAGNOSIS — H02105 Unspecified ectropion of left lower eyelid: Secondary | ICD-10-CM | POA: Diagnosis not present

## 2018-05-22 DIAGNOSIS — M5412 Radiculopathy, cervical region: Secondary | ICD-10-CM | POA: Diagnosis not present

## 2018-06-02 DIAGNOSIS — M4153 Other secondary scoliosis, cervicothoracic region: Secondary | ICD-10-CM | POA: Diagnosis not present

## 2018-06-02 DIAGNOSIS — M542 Cervicalgia: Secondary | ICD-10-CM | POA: Diagnosis not present

## 2018-06-02 DIAGNOSIS — M6281 Muscle weakness (generalized): Secondary | ICD-10-CM | POA: Diagnosis not present

## 2018-06-03 DIAGNOSIS — D044 Carcinoma in situ of skin of scalp and neck: Secondary | ICD-10-CM | POA: Diagnosis not present

## 2018-06-04 DIAGNOSIS — M4153 Other secondary scoliosis, cervicothoracic region: Secondary | ICD-10-CM | POA: Diagnosis not present

## 2018-06-04 DIAGNOSIS — M6281 Muscle weakness (generalized): Secondary | ICD-10-CM | POA: Diagnosis not present

## 2018-06-04 DIAGNOSIS — M542 Cervicalgia: Secondary | ICD-10-CM | POA: Diagnosis not present

## 2018-06-09 DIAGNOSIS — M6281 Muscle weakness (generalized): Secondary | ICD-10-CM | POA: Diagnosis not present

## 2018-06-09 DIAGNOSIS — M542 Cervicalgia: Secondary | ICD-10-CM | POA: Diagnosis not present

## 2018-06-09 DIAGNOSIS — M4153 Other secondary scoliosis, cervicothoracic region: Secondary | ICD-10-CM | POA: Diagnosis not present

## 2018-06-11 DIAGNOSIS — M1712 Unilateral primary osteoarthritis, left knee: Secondary | ICD-10-CM | POA: Diagnosis not present

## 2018-07-14 DIAGNOSIS — H04523 Eversion of bilateral lacrimal punctum: Secondary | ICD-10-CM | POA: Diagnosis not present

## 2018-07-14 DIAGNOSIS — H02115 Cicatricial ectropion of left lower eyelid: Secondary | ICD-10-CM | POA: Diagnosis not present

## 2018-07-14 DIAGNOSIS — H04552 Acquired stenosis of left nasolacrimal duct: Secondary | ICD-10-CM | POA: Diagnosis not present

## 2018-07-14 DIAGNOSIS — H04223 Epiphora due to insufficient drainage, bilateral lacrimal glands: Secondary | ICD-10-CM | POA: Diagnosis not present

## 2018-07-14 DIAGNOSIS — D485 Neoplasm of uncertain behavior of skin: Secondary | ICD-10-CM | POA: Diagnosis not present

## 2018-07-14 DIAGNOSIS — H02142 Spastic ectropion of right lower eyelid: Secondary | ICD-10-CM | POA: Diagnosis not present

## 2018-07-14 DIAGNOSIS — H02135 Senile ectropion of left lower eyelid: Secondary | ICD-10-CM | POA: Diagnosis not present

## 2018-07-14 DIAGNOSIS — H02112 Cicatricial ectropion of right lower eyelid: Secondary | ICD-10-CM | POA: Diagnosis not present

## 2018-07-14 DIAGNOSIS — H02132 Senile ectropion of right lower eyelid: Secondary | ICD-10-CM | POA: Diagnosis not present

## 2018-07-15 DIAGNOSIS — R1032 Left lower quadrant pain: Secondary | ICD-10-CM | POA: Diagnosis not present

## 2018-07-15 DIAGNOSIS — G8929 Other chronic pain: Secondary | ICD-10-CM | POA: Diagnosis not present

## 2018-07-15 DIAGNOSIS — K5730 Diverticulosis of large intestine without perforation or abscess without bleeding: Secondary | ICD-10-CM | POA: Diagnosis not present

## 2018-07-16 DIAGNOSIS — M6281 Muscle weakness (generalized): Secondary | ICD-10-CM | POA: Diagnosis not present

## 2018-07-16 DIAGNOSIS — M4153 Other secondary scoliosis, cervicothoracic region: Secondary | ICD-10-CM | POA: Diagnosis not present

## 2018-07-16 DIAGNOSIS — M542 Cervicalgia: Secondary | ICD-10-CM | POA: Diagnosis not present

## 2018-07-22 DIAGNOSIS — K5730 Diverticulosis of large intestine without perforation or abscess without bleeding: Secondary | ICD-10-CM | POA: Diagnosis not present

## 2018-07-22 DIAGNOSIS — E785 Hyperlipidemia, unspecified: Secondary | ICD-10-CM | POA: Diagnosis not present

## 2018-07-22 DIAGNOSIS — R1032 Left lower quadrant pain: Secondary | ICD-10-CM | POA: Diagnosis not present

## 2018-07-22 DIAGNOSIS — G8929 Other chronic pain: Secondary | ICD-10-CM | POA: Diagnosis not present

## 2018-07-28 ENCOUNTER — Encounter: Payer: Self-pay | Admitting: Internal Medicine

## 2018-07-28 DIAGNOSIS — H5203 Hypermetropia, bilateral: Secondary | ICD-10-CM | POA: Diagnosis not present

## 2018-07-28 DIAGNOSIS — H524 Presbyopia: Secondary | ICD-10-CM | POA: Diagnosis not present

## 2018-07-28 DIAGNOSIS — H52223 Regular astigmatism, bilateral: Secondary | ICD-10-CM | POA: Diagnosis not present

## 2018-08-06 DIAGNOSIS — Z85828 Personal history of other malignant neoplasm of skin: Secondary | ICD-10-CM | POA: Diagnosis not present

## 2018-08-06 DIAGNOSIS — L821 Other seborrheic keratosis: Secondary | ICD-10-CM | POA: Diagnosis not present

## 2018-08-06 DIAGNOSIS — L570 Actinic keratosis: Secondary | ICD-10-CM | POA: Diagnosis not present

## 2018-08-06 DIAGNOSIS — B009 Herpesviral infection, unspecified: Secondary | ICD-10-CM | POA: Diagnosis not present

## 2018-08-06 DIAGNOSIS — Z08 Encounter for follow-up examination after completed treatment for malignant neoplasm: Secondary | ICD-10-CM | POA: Diagnosis not present

## 2018-08-08 ENCOUNTER — Other Ambulatory Visit (INDEPENDENT_AMBULATORY_CARE_PROVIDER_SITE_OTHER): Payer: Medicare HMO

## 2018-08-08 ENCOUNTER — Encounter: Payer: Self-pay | Admitting: Internal Medicine

## 2018-08-08 ENCOUNTER — Ambulatory Visit: Payer: Medicare HMO | Admitting: Internal Medicine

## 2018-08-08 ENCOUNTER — Encounter (INDEPENDENT_AMBULATORY_CARE_PROVIDER_SITE_OTHER): Payer: Self-pay

## 2018-08-08 VITALS — BP 106/70 | HR 62 | Ht 69.0 in | Wt 199.0 lb

## 2018-08-08 DIAGNOSIS — D7589 Other specified diseases of blood and blood-forming organs: Secondary | ICD-10-CM

## 2018-08-08 DIAGNOSIS — R1032 Left lower quadrant pain: Secondary | ICD-10-CM

## 2018-08-08 DIAGNOSIS — R195 Other fecal abnormalities: Secondary | ICD-10-CM | POA: Diagnosis not present

## 2018-08-08 DIAGNOSIS — R198 Other specified symptoms and signs involving the digestive system and abdomen: Secondary | ICD-10-CM | POA: Diagnosis not present

## 2018-08-08 LAB — VITAMIN B12: Vitamin B-12: 489 pg/mL (ref 211–911)

## 2018-08-08 NOTE — Progress Notes (Signed)
Kristopher [name] 77 y.o. 1941-08-01 [id number]  Assessment & Plan:   Encounter Diagnoses  Name Primary?  Marland Kitchen LLQ pain Yes  . Borborygmi   . Loose stools   . Macrocytosis    It sounds to me like he might very well have small intestinal bacterial overgrowth I am going to do a lactulose hydrogen breath test to see if that is the case.  Consider celiac testing if that is negative.  I checked out his macrocytosis his B12 is normal.   I appreciate the opportunity to care for this patient. CC: Kristopher Bellows, PA-C  Subjective:   Chief Complaint: Gas lower abdominal pain bowel movement changes  HPI The patient is here with his wife with a wound 1 perhaps 2-year history of intermittent borborygmi gas symptoms and excessive gas with flatus blowout bowel movements.  Some days are fine some days or not.  Bowel movements are variable there is left lower quadrant pain.  Colonoscopy by Dr. Earle Gell for polyp surveillance showed a 4 mm adenoma, last year in March 2018.  He has diverticulosis.  He went to the emergency department because of left lower quadrant pain and had a negative CT scan other than diverticulosis in 2017.  He does think fried or greasy foods make it worse.  Milk is okay he does not use artificial sweeteners.  There is no unintentional weight loss.  Weight goes up in the winter back down in the summer. Allergies  Allergen Reactions  . Fire Ant     Burning and itching all over  . Wasp Venom    Current Meds  Medication Sig  . Artificial Tear Solution (SOOTHE XP OP) Apply 1 drop to eye daily as needed (dry eyes).  . Cyanocobalamin (VITAMIN B-12 PO) Take 1 tablet by mouth daily.  . diphenhydramine-acetaminophen (TYLENOL PM) 25-500 MG TABS tablet Take 2 tablets by mouth at bedtime.   Marland Kitchen EPINEPHrine 0.3 mg/0.3 mL IJ SOAJ injection   . fluticasone (FLONASE) 50 MCG/ACT nasal spray Place 2 sprays into both nostrils at bedtime.   Marland Kitchen oxymetazoline (AFRIN) 0.05 % nasal  spray Place 1 spray into both nostrils at bedtime.   . ranitidine (ZANTAC) 300 MG tablet Take 300 mg by mouth daily.   Past Medical History:  Diagnosis Date  . Allergic rhinitis   . Biceps tendon rupture   . C6 radiculopathy   . Cataract   . Colon adenoma   . ED (erectile dysfunction)   . Esophageal reflux   . Frequent headaches   . Hyperglyceridemia   . Lateral epicondylitis   . Lumbar radiculopathy   . OA (osteoarthritis)   . Plantar fasciitis   . Retinal detachment    left eye   Past Surgical History:  Procedure Laterality Date  . COLONOSCOPY     x 2  . COLONOSCOPY WITH PROPOFOL N/A 03/05/2017   Procedure: COLONOSCOPY WITH PROPOFOL;  Surgeon: Garlan Fair, MD;  Location: [facility];  Service: Endoscopy;  Laterality: N/A;  . [place]  . TONSILLECTOMY  age 1   Social History   Social History Narrative   He is married he has a son and a daughter and grandchildren and they are in the area.  [place] but is been in his family for 200+ years I think.   He is a retired Therapist, music.   He does not smoke use tobacco ethanol or drugs.  He is a  remote history of short-term tobacco chewing.   family history includes Anxiety disorder in his mother; CAD in his father; COPD in his father; Congestive Heart Failure in his father; Dementia in his father; GER disease in his mother; Glaucoma in his maternal aunt; Pancreatic cancer in his mother.   Review of Systems As per HPI some muscle pains joint pains insomnia pedal edema allergies and irritated skin.  All other review of systems are negative.  Objective:   Physical Exam .@BP  106/70   Pulse 62   Ht 5\' 9"  (1.753 m)   Wt 199 lb (90.3 kg)   BMI 29.39 kg/m @  General:  Well-developed, well-nourished and in no acute distress Eyes:  anicteric. ENT:   Mouth and posterior pharynx free of lesions.  Neck:   supple w/o thyromegaly or mass.  Lungs: Clear to auscultation  bilaterally. Heart:  S1S2, no rubs, murmurs, gallops. Abdomen:  soft, non-tender, no hepatosplenomegaly, hernia, or mass and BS+.  Lymph:  no cervical or supraclavicular adenopathy. Extremities:   no edema, cyanosis or clubbing Skin   no rash. On trunk - sun changes Neuro:  A&O x 3.  Psych:  appropriate mood and  Affect.   Data Reviewed: See HPI and I have reviewed primary care notes and labs from this year and earlier years in the EMR through care everywhere.  MCV was 101.3 in May its been as high as 104.1 in December 2016.  So this is chronic.  He has not been anemic his hemoglobin is in the 14 range platelets and white count are normal.  Liver tests kidney function electrolytes all normal in August of this year.

## 2018-08-09 NOTE — Progress Notes (Signed)
Let him know B12 level is ok Will discuss more after SIBO test back

## 2018-08-14 ENCOUNTER — Encounter: Payer: Self-pay | Admitting: Internal Medicine

## 2018-08-14 DIAGNOSIS — R198 Other specified symptoms and signs involving the digestive system and abdomen: Secondary | ICD-10-CM | POA: Diagnosis not present

## 2018-08-14 DIAGNOSIS — R1032 Left lower quadrant pain: Secondary | ICD-10-CM | POA: Diagnosis not present

## 2018-08-14 DIAGNOSIS — R195 Other fecal abnormalities: Secondary | ICD-10-CM | POA: Diagnosis not present

## 2018-08-20 ENCOUNTER — Encounter: Payer: Self-pay | Admitting: Internal Medicine

## 2018-08-20 ENCOUNTER — Telehealth: Payer: Self-pay

## 2018-08-20 DIAGNOSIS — K638219 Small intestinal bacterial overgrowth, unspecified: Secondary | ICD-10-CM

## 2018-08-20 DIAGNOSIS — K6389 Other specified diseases of intestine: Secondary | ICD-10-CM

## 2018-08-20 HISTORY — DX: Small intestinal bacterial overgrowth, unspecified: K63.8219

## 2018-08-20 HISTORY — DX: Other specified diseases of intestine: K63.89

## 2018-08-20 MED ORDER — RIFAXIMIN 550 MG PO TABS: 550.0000 mg | ORAL_TABLET | Freq: Three times a day (TID) | ORAL | 0 refills | Status: DC

## 2018-08-20 NOTE — Telephone Encounter (Signed)
-----   Message from Gatha Mayer, MD sent at 08/20/2018  1:54 PM EDT ----- Regarding: SIBO test + The SIBO test is +  Pleasee rx xifaxan 550 mg tid x 14 days if can get it at decent cost (or sample)  Ask him to call back 2 weeks after taking the treatment with a sx updtae

## 2018-08-20 NOTE — Telephone Encounter (Signed)
Patient's xifaxan needed a prior authorization. We were able to get samples for him to take so there will be no delay in his treatment. His wife has been informed and they will come Friday to pick up. Samples put up front along with instructions. Pharmacy informed of this.

## 2018-08-20 NOTE — Telephone Encounter (Signed)
patients wife notified rx sent.  She will call back if the rx is too expensive. They understand to call 2 weeks after tx with an update.

## 2018-08-26 NOTE — Telephone Encounter (Signed)
Error

## 2018-09-08 NOTE — Telephone Encounter (Signed)
Pt's wife called to inform that they will pick up samples tomorrow

## 2018-09-09 ENCOUNTER — Telehealth: Payer: Self-pay | Admitting: Internal Medicine

## 2018-09-09 NOTE — Telephone Encounter (Signed)
Spoke with wife and questions answered (taking the xifaxan  for positive SIBO test, one TID x 14 days) and I reminded her to call us back 2 weeks after treatment with an update.

## 2018-09-15 ENCOUNTER — Telehealth: Payer: Self-pay

## 2018-09-16 NOTE — Telephone Encounter (Signed)
OPENED IN ERROR

## 2018-10-09 NOTE — Telephone Encounter (Signed)
Left him a voice mail to call me back.

## 2018-10-09 NOTE — Telephone Encounter (Signed)
Pt called for the 2 weeks F/U after treatment.

## 2018-10-10 NOTE — Telephone Encounter (Signed)
Left messages on both home and mobile #'s to call me back with an update and to book an appointment.

## 2018-10-10 NOTE — Telephone Encounter (Signed)
Patient is better and made f/u appointment for November 20th as he is having eye surgery on November 12 th.

## 2018-10-20 DIAGNOSIS — H04223 Epiphora due to insufficient drainage, bilateral lacrimal glands: Secondary | ICD-10-CM | POA: Diagnosis not present

## 2018-10-20 DIAGNOSIS — H02135 Senile ectropion of left lower eyelid: Secondary | ICD-10-CM | POA: Diagnosis not present

## 2018-10-20 DIAGNOSIS — H04222 Epiphora due to insufficient drainage, left lacrimal gland: Secondary | ICD-10-CM | POA: Diagnosis not present

## 2018-10-20 DIAGNOSIS — H02132 Senile ectropion of right lower eyelid: Secondary | ICD-10-CM | POA: Diagnosis not present

## 2018-10-20 DIAGNOSIS — H02115 Cicatricial ectropion of left lower eyelid: Secondary | ICD-10-CM | POA: Diagnosis not present

## 2018-10-20 DIAGNOSIS — H02112 Cicatricial ectropion of right lower eyelid: Secondary | ICD-10-CM | POA: Diagnosis not present

## 2018-10-20 DIAGNOSIS — D485 Neoplasm of uncertain behavior of skin: Secondary | ICD-10-CM | POA: Diagnosis not present

## 2018-10-20 DIAGNOSIS — H04552 Acquired stenosis of left nasolacrimal duct: Secondary | ICD-10-CM | POA: Diagnosis not present

## 2018-10-20 DIAGNOSIS — H04412 Chronic dacryocystitis of left lacrimal passage: Secondary | ICD-10-CM | POA: Diagnosis not present

## 2018-10-29 ENCOUNTER — Ambulatory Visit: Payer: Medicare HMO | Admitting: Internal Medicine

## 2018-10-29 ENCOUNTER — Encounter: Payer: Self-pay | Admitting: Internal Medicine

## 2018-10-29 DIAGNOSIS — K6389 Other specified diseases of intestine: Secondary | ICD-10-CM | POA: Diagnosis not present

## 2018-10-29 DIAGNOSIS — K219 Gastro-esophageal reflux disease without esophagitis: Secondary | ICD-10-CM | POA: Diagnosis not present

## 2018-10-29 MED ORDER — FAMOTIDINE 40 MG PO TABS: 40.0000 mg | ORAL_TABLET | Freq: Every day | ORAL | 3 refills | Status: DC

## 2018-10-29 NOTE — Assessment & Plan Note (Signed)
Change to 40 mg qd famotidine

## 2018-10-29 NOTE — Patient Instructions (Signed)
  Follow up with Dr Carlean Purl as needed.    Call us back if you get symptoms again.   We have sent the following medications to your pharmacy for you to pick up at your convenience: Generic Pepcid to replace your generic Zantac   I appreciate the opportunity to care for you. Silvano Rusk, MD, [facility]

## 2018-10-29 NOTE — Assessment & Plan Note (Signed)
Successful tx F/U prn call back if sxs recur

## 2018-10-29 NOTE — Progress Notes (Signed)
   Kristopher [name] 77 y.o. 1941/06/13 [id number]  Assessment & Plan:   Small intestinal bacterial overgrowth Successful tx F/U prn call back if sxs recur  GERD without esophagitis Change to 40 mg qd famotidine     I appreciate the opportunity to care for this patient. CC: Jonathon Bellows, PA-C   Subjective:   Chief Complaint:  HPI Patient is here with his wife, reporting that his abdominal pain gas borborygmi and diarrhea are gone after treatment with Xifaxan.  He had a positive lactulose hydrogen breath test for small intestinal bacterial overgrowth. Allergies  Allergen Reactions  . Fire Ant     Burning and itching all over  . Wasp Venom    Current Meds  Medication Sig  . Artificial Tear Solution (SOOTHE XP OP) Apply 1 drop to eye daily as needed (dry eyes).  . Cyanocobalamin (VITAMIN B-12 PO) Take 1 tablet by mouth daily.  . diphenhydramine-acetaminophen (TYLENOL PM) 25-500 MG TABS tablet Take 2 tablets by mouth at bedtime.   Marland Kitchen EPINEPHrine 0.3 mg/0.3 mL IJ SOAJ injection   . fluticasone (FLONASE) 50 MCG/ACT nasal spray Place 2 sprays into both nostrils at bedtime.   . [DISCONTINUED] ranitidine (ZANTAC) 300 MG tablet Take 300 mg by mouth daily.   Past Medical History:  Diagnosis Date  . Allergic rhinitis   . Biceps tendon rupture   . C6 radiculopathy   . Cataract   . Colon adenoma   . ED (erectile dysfunction)   . Esophageal reflux   . Frequent headaches   . Hyperglyceridemia   . Lateral epicondylitis   . Lumbar radiculopathy   . OA (osteoarthritis)   . Plantar fasciitis   . Retinal detachment    left eye  . 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 COLONOSCOPY     x 2  . COLONOSCOPY WITH PROPOFOL N/A 03/05/2017   Procedure: COLONOSCOPY WITH PROPOFOL;  Surgeon: Garlan Fair, MD;  Location: [facility];  Service: Endoscopy;   Laterality: N/A;  . EYE SURGERY    . LUMBAR LAMINECTOMY  1993  . TONSILLECTOMY  age 30   Social History   Social History Narrative   He is married he has a son and a daughter and grandchildren and they are in the area.  [place] but is been in his family for 200+ years I think.   He is a retired Therapist, music.   He does not smoke use tobacco ethanol or drugs.  He is a remote history of short-term tobacco chewing.   family history includes Anxiety disorder in his mother; CAD in his father; COPD in his father; Congestive Heart Failure in his father; Dementia in his father; GER disease in his mother; Glaucoma in his maternal aunt; Pancreatic cancer in his mother.   Review of Systems As above Objective:   Physical Exam BP 110/76   Pulse 76   Ht 5\' 8"  (1.727 m)   Wt 201 lb 12.8 oz (91.5 kg)   BMI 30.68 kg/m

## 2018-11-04 DIAGNOSIS — H02135 Senile ectropion of left lower eyelid: Secondary | ICD-10-CM | POA: Diagnosis not present

## 2018-11-04 DIAGNOSIS — H02132 Senile ectropion of right lower eyelid: Secondary | ICD-10-CM | POA: Diagnosis not present

## 2018-11-04 DIAGNOSIS — H04223 Epiphora due to insufficient drainage, bilateral lacrimal glands: Secondary | ICD-10-CM | POA: Diagnosis not present

## 2018-11-04 DIAGNOSIS — H02142 Spastic ectropion of right lower eyelid: Secondary | ICD-10-CM | POA: Diagnosis not present

## 2018-11-04 DIAGNOSIS — H04222 Epiphora due to insufficient drainage, left lacrimal gland: Secondary | ICD-10-CM | POA: Diagnosis not present

## 2018-11-04 DIAGNOSIS — H02115 Cicatricial ectropion of left lower eyelid: Secondary | ICD-10-CM | POA: Diagnosis not present

## 2018-11-04 DIAGNOSIS — H04552 Acquired stenosis of left nasolacrimal duct: Secondary | ICD-10-CM | POA: Diagnosis not present

## 2018-11-04 DIAGNOSIS — H02112 Cicatricial ectropion of right lower eyelid: Secondary | ICD-10-CM | POA: Diagnosis not present

## 2018-11-04 DIAGNOSIS — D485 Neoplasm of uncertain behavior of skin: Secondary | ICD-10-CM | POA: Diagnosis not present

## 2018-11-19 DIAGNOSIS — M25531 Pain in right wrist: Secondary | ICD-10-CM | POA: Diagnosis not present

## 2018-12-15 DIAGNOSIS — H04222 Epiphora due to insufficient drainage, left lacrimal gland: Secondary | ICD-10-CM | POA: Diagnosis not present

## 2018-12-15 DIAGNOSIS — H04552 Acquired stenosis of left nasolacrimal duct: Secondary | ICD-10-CM | POA: Diagnosis not present

## 2018-12-15 DIAGNOSIS — H02112 Cicatricial ectropion of right lower eyelid: Secondary | ICD-10-CM | POA: Diagnosis not present

## 2018-12-15 DIAGNOSIS — H02135 Senile ectropion of left lower eyelid: Secondary | ICD-10-CM | POA: Diagnosis not present

## 2018-12-15 DIAGNOSIS — H02115 Cicatricial ectropion of left lower eyelid: Secondary | ICD-10-CM | POA: Diagnosis not present

## 2018-12-15 DIAGNOSIS — H04223 Epiphora due to insufficient drainage, bilateral lacrimal glands: Secondary | ICD-10-CM | POA: Diagnosis not present

## 2018-12-15 DIAGNOSIS — H02132 Senile ectropion of right lower eyelid: Secondary | ICD-10-CM | POA: Diagnosis not present

## 2018-12-15 DIAGNOSIS — D485 Neoplasm of uncertain behavior of skin: Secondary | ICD-10-CM | POA: Diagnosis not present

## 2018-12-15 DIAGNOSIS — Z09 Encounter for follow-up examination after completed treatment for conditions other than malignant neoplasm: Secondary | ICD-10-CM | POA: Diagnosis not present

## 2018-12-30 DIAGNOSIS — N289 Disorder of kidney and ureter, unspecified: Secondary | ICD-10-CM | POA: Diagnosis not present

## 2018-12-30 DIAGNOSIS — E785 Hyperlipidemia, unspecified: Secondary | ICD-10-CM | POA: Diagnosis not present

## 2018-12-30 DIAGNOSIS — R7303 Prediabetes: Secondary | ICD-10-CM | POA: Diagnosis not present

## 2019-01-07 DIAGNOSIS — R0789 Other chest pain: Secondary | ICD-10-CM | POA: Diagnosis not present

## 2019-01-13 DIAGNOSIS — H04551 Acquired stenosis of right nasolacrimal duct: Secondary | ICD-10-CM | POA: Diagnosis not present

## 2019-01-13 DIAGNOSIS — H02132 Senile ectropion of right lower eyelid: Secondary | ICD-10-CM | POA: Diagnosis not present

## 2019-01-13 DIAGNOSIS — H02115 Cicatricial ectropion of left lower eyelid: Secondary | ICD-10-CM | POA: Diagnosis not present

## 2019-01-13 DIAGNOSIS — H04223 Epiphora due to insufficient drainage, bilateral lacrimal glands: Secondary | ICD-10-CM | POA: Diagnosis not present

## 2019-01-13 DIAGNOSIS — Z09 Encounter for follow-up examination after completed treatment for conditions other than malignant neoplasm: Secondary | ICD-10-CM | POA: Diagnosis not present

## 2019-01-13 DIAGNOSIS — H02112 Cicatricial ectropion of right lower eyelid: Secondary | ICD-10-CM | POA: Diagnosis not present

## 2019-01-13 DIAGNOSIS — H04222 Epiphora due to insufficient drainage, left lacrimal gland: Secondary | ICD-10-CM | POA: Diagnosis not present

## 2019-01-13 DIAGNOSIS — H02135 Senile ectropion of left lower eyelid: Secondary | ICD-10-CM | POA: Diagnosis not present

## 2019-01-13 DIAGNOSIS — D485 Neoplasm of uncertain behavior of skin: Secondary | ICD-10-CM | POA: Diagnosis not present

## 2019-01-21 ENCOUNTER — Telehealth: Payer: Self-pay | Admitting: Internal Medicine

## 2019-01-21 MED ORDER — RIFAXIMIN 550 MG PO TABS: 550.0000 mg | ORAL_TABLET | Freq: Three times a day (TID) | ORAL | 0 refills | Status: DC

## 2019-01-21 NOTE — Telephone Encounter (Signed)
Patient has had abdominal pain, diarrhea, and bloating again.  Similar to symptoms he had with SIBO.  Is he a candidate for re- treatment with xifaxan?

## 2019-01-21 NOTE — Telephone Encounter (Signed)
rx sent Left message for patient to call back  

## 2019-01-21 NOTE — Telephone Encounter (Signed)
Pt's wife called to report that pt is having abdominal pain, diarrhea and bloating.  Dr. Celesta Aver first availability is February 16, 2019, and pt is not willing to see a PA.  Pt requested to be seen sooner.

## 2019-01-21 NOTE — Telephone Encounter (Signed)
Please repeat Xifaxxin 550 mg tid x 14 days

## 2019-01-21 NOTE — Telephone Encounter (Signed)
Patient's wife does not want to just have him treated.  She wants him seen by MD.  Patient is scheduled for 01/27/19 with Dr. Carlean Purl

## 2019-01-22 ENCOUNTER — Telehealth: Payer: Self-pay | Admitting: Internal Medicine

## 2019-01-22 NOTE — Telephone Encounter (Signed)
.  A user error has taken place: error

## 2019-01-22 NOTE — Telephone Encounter (Signed)
Pt's wife called to inform that [facility] claims they did not receive prescription for Xifaxan.  Please resend prescription.

## 2019-01-22 NOTE — Telephone Encounter (Signed)
Per [name], [facility]  Kristopher [name] doesn't need to see Dr Carlean Purl next week if he's going to take the xifaxan treatment. I called them and he is going keep his appointment and not come get the samples. Per his wife he's not having diarrhea.

## 2019-01-27 ENCOUNTER — Encounter: Payer: Self-pay | Admitting: Internal Medicine

## 2019-01-27 ENCOUNTER — Ambulatory Visit: Payer: 59 | Admitting: Internal Medicine

## 2019-01-27 VITALS — BP 110/75 | HR 73 | Ht 68.0 in | Wt 204.0 lb

## 2019-01-27 DIAGNOSIS — K6389 Other specified diseases of intestine: Secondary | ICD-10-CM

## 2019-01-27 DIAGNOSIS — K219 Gastro-esophageal reflux disease without esophagitis: Secondary | ICD-10-CM

## 2019-01-27 DIAGNOSIS — R1310 Dysphagia, unspecified: Secondary | ICD-10-CM | POA: Diagnosis not present

## 2019-01-27 DIAGNOSIS — R1319 Other dysphagia: Secondary | ICD-10-CM

## 2019-01-27 MED ORDER — PANTOPRAZOLE SODIUM 40 MG PO TBEC: 40.0000 mg | DELAYED_RELEASE_TABLET | Freq: Every day | ORAL | 3 refills | Status: DC

## 2019-01-27 NOTE — Progress Notes (Signed)
Kristopher [name] 78 y.o. [birth year] [id number]  Assessment & Plan:   Encounter Diagnoses  Name Primary?  . Esophageal dysphagia Yes  . Gastroesophageal reflux disease, esophagitis presence not specified   . 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  Change famotidine to pantoprazole 40 mg daily. Dysphagia 3 diet Schedule EGD and dilation which has been scheduled for February 24.  Stay on dysphagia 3 diet until then.  The risks and benefits as well as alternatives of endoscopic procedure(s) have been discussed and reviewed. All questions answered. The patient agrees to proceed.  I appreciate the opportunity to care for this patient.  CC: Kristopher Bellows, PA-C    Subjective:   Chief Complaint: Dysphagia  HPI Mr. Kristopher [name] is here with his wife because of abdominal pain and dysphagia.  He had called in indicating that he thought that his small intestinal bacterial overgrowth problems were back but his wife insisted that he come in because she thought it was different.  He is having borborygmi and belching, but in the past week he has had 2 episodes of impacted dysphagia to rice and then steak.  The rice problem was quite severe and he almost went to the emergency room.  Thinking back it is been a year or so when he had an episode.  He does have a lot of heartburn and he switched to Pepcid from ranitidine but is still having problems.  The ranitidine might have worked better.  Wt Readings from Last 3 Encounters:  01/27/19 204 lb (92.5 kg)  10/29/18 201 lb 12.8 oz (91.5 kg)  08/08/18 199 lb (90.3 kg)   He is not having any diarrhea or as severe symptom complex as he was prior to Xifaxan.  He does worry that GI symptoms could represent pancreatic cancer which took his mother's life.  Normal pancreas on March 2017 CT of the abdomen and pelvis Allergies    Allergen Reactions  . Fire Ant     Burning and itching all over  . Wasp Venom    Current Meds  Medication Sig  . Artificial Tear Solution (SOOTHE XP OP) Apply 1 drop to eye daily as needed (dry eyes).  . Cyanocobalamin (VITAMIN B-12 PO) Take 1 tablet by mouth daily.  . diphenhydramine-acetaminophen (TYLENOL PM) 25-500 MG TABS tablet Take 2 tablets by mouth at bedtime.   Marland Kitchen EPINEPHrine 0.3 mg/0.3 mL IJ SOAJ injection   . fluticasone (FLONASE) 50 MCG/ACT nasal spray Place 2 sprays into both nostrils at bedtime.   . [DISCONTINUED] famotidine (PEPCID) 40 MG tablet Take 1 tablet (40 mg total) by mouth daily.   Past Medical History:  Diagnosis Date  . Allergic rhinitis   . Biceps tendon rupture   . C6 radiculopathy   . Cataract   . Colon adenoma   . ED (erectile dysfunction)   . Esophageal reflux   . Frequent headaches   . Hyperglyceridemia   . Lateral epicondylitis   . Lumbar radiculopathy   . OA (osteoarthritis)   . Plantar fasciitis   . Retinal detachment    left eye  . 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 COLONOSCOPY     x 2  . COLONOSCOPY WITH PROPOFOL N/A 03/05/2017   Procedure:  COLONOSCOPY WITH PROPOFOL;  Surgeon: Garlan Fair, MD;  Location: [facility];  Service: Endoscopy;  Laterality: N/A;  . EYE SURGERY    . LUMBAR LAMINECTOMY  1993  . TONSILLECTOMY  age 46   Social History   Social History Narrative   He is married he has a son and a daughter and grandchildren and they are in the area.  [place] but is been in his family for 200+ years I think.   He is a retired Therapist, music.   He does not smoke use tobacco ethanol or drugs.  He is a remote history of short-term tobacco chewing.   family history includes Anxiety disorder in his mother; CAD in his father; COPD in his father; Congestive Heart Failure in his  father; Dementia in his father; GER disease in his mother; Glaucoma in his maternal aunt; Pancreatic cancer in his mother.   Review of Systems See HPI  Objective:   Physical Exam @BP  110/75   Pulse 73   Ht 5\' 8"  (1.727 m)   Wt 204 lb (92.5 kg)   BMI 31.02 kg/m @  General:  NAD Eyes:   Anicteric Neck:  Supple without mass or thyromegaly or lymphadenopathy Lungs:  clear Heart::  S1S2 no rubs, murmurs or gallops Abdomen:  soft and nontender, BS+     Data Reviewed:   See HPI

## 2019-01-27 NOTE — Patient Instructions (Signed)
  We have sent the following medications to your pharmacy for you to pick up at your convenience: Protonix  Stop your Pepcid per Dr Carlean Purl.   We are giving you a dysphagia diet to use as a guide, follow step #3.   You have been scheduled for an endoscopy. Please follow written instructions given to you at your visit today. If you use inhalers (even only as needed), please bring them with you on the day of your procedure.   I appreciate the opportunity to care for you. Silvano Rusk, MD, [facility]

## 2019-02-02 ENCOUNTER — Encounter: Payer: Self-pay | Admitting: Internal Medicine

## 2019-02-02 ENCOUNTER — Ambulatory Visit (AMBULATORY_SURGERY_CENTER): Payer: Medicare HMO | Admitting: Internal Medicine

## 2019-02-02 VITALS — BP 121/73 | HR 60 | Temp 97.1°F | Resp 20 | Ht 68.0 in | Wt 204.0 lb

## 2019-02-02 DIAGNOSIS — K2970 Gastritis, unspecified, without bleeding: Secondary | ICD-10-CM | POA: Diagnosis not present

## 2019-02-02 DIAGNOSIS — K3189 Other diseases of stomach and duodenum: Secondary | ICD-10-CM | POA: Diagnosis not present

## 2019-02-02 DIAGNOSIS — R1310 Dysphagia, unspecified: Secondary | ICD-10-CM | POA: Diagnosis not present

## 2019-02-02 DIAGNOSIS — K2990 Gastroduodenitis, unspecified, without bleeding: Secondary | ICD-10-CM

## 2019-02-02 DIAGNOSIS — R1319 Other dysphagia: Secondary | ICD-10-CM

## 2019-02-02 DIAGNOSIS — B3781 Candidal esophagitis: Secondary | ICD-10-CM | POA: Diagnosis not present

## 2019-02-02 DIAGNOSIS — K219 Gastro-esophageal reflux disease without esophagitis: Secondary | ICD-10-CM | POA: Diagnosis not present

## 2019-02-02 MED ORDER — FLUCONAZOLE 100 MG PO TABS: 100.0000 mg | ORAL_TABLET | Freq: Every day | ORAL | 0 refills | Status: DC

## 2019-02-02 MED ORDER — SODIUM CHLORIDE 0.9 % IV SOLN: 500.0000 mL | Freq: Once | INTRAVENOUS | Status: DC

## 2019-02-02 NOTE — Progress Notes (Signed)
Report to PACU, RN, vss, BBS= Clear.  

## 2019-02-02 NOTE — Progress Notes (Signed)
Called to room to assist during endoscopic procedure.  Patient ID and intended procedure confirmed with present staff. Received instructions for my participation in the procedure from the performing physician.  

## 2019-02-02 NOTE — Op Note (Signed)
[place] Patient Name: Kristopher [name] Procedure Date: 02/02/2019 3:32 PM MRN: [id number] Endoscopist: Gatha Mayer , MD Age: 78 Referring MD:  Date of Birth: 12-Sep-1941 Gender: Male Account #: [id number] Procedure:                Upper GI endoscopy Indications:              Dysphagia Medicines:                Propofol per Anesthesia, Monitored Anesthesia Care Procedure:                Pre-Anesthesia Assessment:                           - Prior to the procedure, a History and Physical                            was performed, and patient medications and                            allergies were reviewed. The patient's tolerance of                            previous anesthesia was also reviewed. The risks                            and benefits of the procedure and the sedation                            options and risks were discussed with the patient.                            All questions were answered, and informed consent                            was obtained. Prior Anticoagulants: The patient has                            taken no previous anticoagulant or antiplatelet                            agents. ASA Grade Assessment: II - A patient with                            mild systemic disease. After reviewing the risks                            and benefits, the patient was deemed in                            satisfactory condition to undergo the procedure.                           After obtaining informed consent, the endoscope was  passed under direct vision. Throughout the                            procedure, the patient's blood pressure, pulse, and                            oxygen saturations were monitored continuously. The                            Endoscope was introduced through the mouth, and                            advanced to the second part of duodenum. The upper                            GI endoscopy was  accomplished without difficulty.                            The patient tolerated the procedure well. Scope In: Scope Out: Findings:                 Diffuse, white plaques were found in the entire                            esophagus. Biopsies were taken with a cold forceps                            for histology. Verification of patient                            identification for the specimen was done. Estimated                            blood loss was minimal.                           Multiple dispersed small erosions were found in the                            gastric antrum. Biopsies were taken with a cold                            forceps for histology. Verification of patient                            identification for the specimen was done. Estimated                            blood loss was minimal.                           The exam was otherwise without abnormality.                           The cardia and gastric  fundus were normal on                            retroflexion. Complications:            No immediate complications. Estimated Blood Loss:     Estimated blood loss was minimal. Impression:               - Esophageal plaques were found, consistent with                            candidiasis. Biopsied. Recent prednisone likey cause                           - Erosive gastropathy. Biopsied.                           - The examination was otherwise normal. Recommendation:           - Patient has a contact number available for                            emergencies. The signs and symptoms of potential                            delayed complications were discussed with the                            patient. Return to normal activities tomorrow.                            Written discharge instructions were provided to the                            patient.                           - Resume previous diet.                           - Continue present  medications.                           - Await pathology results.                           - Diflucan (fluconazole) 100 mg PO daily for 3                            weeks. Gatha Mayer, MD 02/02/2019 3:59:26 PM This report has been signed electronically.

## 2019-02-02 NOTE — Patient Instructions (Addendum)
There are signs of a yeast infection in your esophagus. I am pretty convinced that is the problem so will treat for it. I did take biopsies also. It can explain your swallowing problems and is likely related to the recent prednisone treatment.  I also took biopsies of the stomach which had some inflammation present.  I appreciate the opportunity to care for you. Gatha Mayer, MD, FACG     YOU HAD AN ENDOSCOPIC PROCEDURE TODAY AT [facility]:   Refer to the procedure report that was given to you for any specific questions about what was found during the examination.  If the procedure report does not answer your questions, please call your gastroenterologist to clarify.  If you requested that your care partner not be given the details of your procedure findings, then the procedure report has been included in a sealed envelope for you to review at your convenience later.  YOU SHOULD EXPECT: Some feelings of bloating in the abdomen. Passage of more gas than usual.  Walking can help get rid of the air that was put into your GI tract during the procedure and reduce the bloating. If you had a lower endoscopy (such as a colonoscopy or flexible sigmoidoscopy) you may notice spotting of blood in your stool or on the toilet paper. If you underwent a bowel prep for your procedure, you may not have a normal bowel movement for a few days.  Please Note:  You might notice some irritation and congestion in your nose or some drainage.  This is from the oxygen used during your procedure.  There is no need for concern and it should clear up in a day or so.  SYMPTOMS TO REPORT IMMEDIATELY:   Following upper endoscopy (EGD)  Vomiting of blood or coffee ground material  New chest pain or pain under the shoulder blades  Painful or persistently difficult swallowing  New shortness of breath  Fever of 100F or higher  Black, tarry-looking stools  For urgent or emergent issues, a  gastroenterologist can be reached at any hour by calling [phone].   DIET:  We do recommend a small meal at first, but then you may proceed to your regular diet.  Drink plenty of fluids but you should avoid alcoholic beverages for 24 hours.  ACTIVITY:  You should plan to take it easy for the rest of today and you should NOT DRIVE or use heavy machinery until tomorrow (because of the sedation medicines used during the test).    FOLLOW UP: Our staff will call the number listed on your records the next business day following your procedure to check on you and address any questions or concerns that you may have regarding the information given to you following your procedure. If we do not reach you, we will leave a message.  However, if you are feeling well and you are not experiencing any problems, there is no need to return our call.  We will assume that you have returned to your regular daily activities without incident.  If any biopsies were taken you will be contacted by phone or by letter within the next 1-3 weeks.  Please call us at [phone] if you have not heard about the biopsies in 3 weeks.    SIGNATURES/CONFIDENTIALITY: You and/or your care partner have signed paperwork which will be entered into your electronic medical record.  These signatures attest to the fact that that the information above on your After Visit  Summary has been reviewed and is understood.  Full responsibility of the confidentiality of this discharge information lies with you and/or your care-partner.    Handout was given to your care partner on gastritis. You may resume your current medications today. Await biopsy results. Please call if any questions or concerns.

## 2019-02-02 NOTE — Progress Notes (Signed)
No problems noted in the recovery room. maw 

## 2019-02-03 ENCOUNTER — Telehealth: Payer: Self-pay | Admitting: *Deleted

## 2019-02-03 NOTE — Telephone Encounter (Signed)
  Follow up Call-  Call back number 02/02/2019  Post procedure Call Back phone  # [phone]  Permission to leave phone message Yes  Some recent data might be hidden     Patient questions:  Do you have a fever, pain , or abdominal swelling? No. Pain Score  0 *  Have you tolerated food without any problems? Yes.    Have you been able to return to your normal activities? Yes.    Do you have any questions about your discharge instructions: Diet   No. Medications  No. Follow up visit  No.  Do you have questions or concerns about your Care? No.  Actions: * If pain score is 4 or above: No action needed, pain <4.

## 2019-02-05 ENCOUNTER — Telehealth: Payer: Self-pay | Admitting: Internal Medicine

## 2019-02-05 NOTE — Telephone Encounter (Signed)
[employer] called regarding PA for xifaxan, they wanted to confirm if we received PA form that they faxed on 2/19.

## 2019-02-05 NOTE — Telephone Encounter (Signed)
Spoke with Jeneen Rinks at [facility] and he will close out this request as the patient doesn't need this rx at this time.

## 2019-02-08 ENCOUNTER — Encounter: Payer: Self-pay | Admitting: Internal Medicine

## 2019-02-08 NOTE — Progress Notes (Signed)
[facility] no letter/recal  Office  Let him know that he does have Candida (yeast) in esophagus like I thought. He recently took prednisone and I think that caused this.  If he needs prednisone again (that is ok) he should tell the prescriber this happened and they can Tx to prevent Candida He is on fluconazole so finish and if dysphagia does not resolve let me know.  Gastritis also Take pantoprazole x 2 months and quit - he is on it  See me prn

## 2019-02-09 ENCOUNTER — Telehealth: Payer: Self-pay | Admitting: Internal Medicine

## 2019-02-09 NOTE — Telephone Encounter (Signed)
Wife notified that cough is not related to EGD and should be evaluated by his PCP

## 2019-02-09 NOTE — Telephone Encounter (Signed)
Pt had EGD 2.24.20 and has been experiencing constant coughing.  Pt would like to know if this is a common side effect from the EGD.

## 2019-03-04 ENCOUNTER — Telehealth: Payer: Self-pay

## 2019-03-04 NOTE — Telephone Encounter (Signed)
Patient's wife Kristopher [name] called in and they want to know how long he is to take his pantoprazole? According to his EGD report done 02/02/2019 he is to take it for 2 months and then stop it for gastritis. They will get it refilled. Kristopher [name] is having some gas and stomach pains that they are going to watch and call us back if it worsens. No fever.

## 2019-03-23 DIAGNOSIS — L309 Dermatitis, unspecified: Secondary | ICD-10-CM | POA: Diagnosis not present

## 2019-03-23 DIAGNOSIS — L821 Other seborrheic keratosis: Secondary | ICD-10-CM | POA: Diagnosis not present

## 2019-06-16 ENCOUNTER — Other Ambulatory Visit: Payer: Self-pay | Admitting: Internal Medicine

## 2019-06-17 ENCOUNTER — Other Ambulatory Visit: Payer: Self-pay | Admitting: Internal Medicine

## 2019-06-18 ENCOUNTER — Telehealth: Payer: Self-pay | Admitting: Internal Medicine

## 2019-06-18 NOTE — Telephone Encounter (Signed)
According to the 02/2019 phone note he was to take the pantoprazole for another 2 months and stop and call us if problems. He never stopped it. He said he has to be on something because of his indigestion. He is totally out of his pantoprazole and requesting a refill Sir.

## 2019-06-18 NOTE — Telephone Encounter (Signed)
Pt would like to continue on pantoprazole and requested a refill.  He would like a call back to discuss.

## 2019-06-19 MED ORDER — PANTOPRAZOLE SODIUM 40 MG PO TBEC: 40.0000 mg | DELAYED_RELEASE_TABLET | Freq: Every day | ORAL | 3 refills | Status: DC

## 2019-06-19 NOTE — Telephone Encounter (Signed)
Patient informed. 

## 2019-06-19 NOTE — Telephone Encounter (Signed)
Let him know I refilled it x 1 year w/ 90 d supplies

## 2019-07-14 DIAGNOSIS — K219 Gastro-esophageal reflux disease without esophagitis: Secondary | ICD-10-CM | POA: Diagnosis not present

## 2019-07-14 DIAGNOSIS — N183 Chronic kidney disease, stage 3 (moderate): Secondary | ICD-10-CM | POA: Diagnosis not present

## 2019-07-14 DIAGNOSIS — Z0000 Encounter for general adult medical examination without abnormal findings: Secondary | ICD-10-CM | POA: Diagnosis not present

## 2019-07-14 DIAGNOSIS — R739 Hyperglycemia, unspecified: Secondary | ICD-10-CM | POA: Diagnosis not present

## 2019-07-21 DIAGNOSIS — R739 Hyperglycemia, unspecified: Secondary | ICD-10-CM | POA: Diagnosis not present

## 2019-07-21 DIAGNOSIS — N183 Chronic kidney disease, stage 3 (moderate): Secondary | ICD-10-CM | POA: Diagnosis not present

## 2019-07-21 DIAGNOSIS — E785 Hyperlipidemia, unspecified: Secondary | ICD-10-CM | POA: Diagnosis not present

## 2019-07-30 DIAGNOSIS — H524 Presbyopia: Secondary | ICD-10-CM | POA: Diagnosis not present

## 2019-07-30 DIAGNOSIS — H52223 Regular astigmatism, bilateral: Secondary | ICD-10-CM | POA: Diagnosis not present

## 2019-07-30 DIAGNOSIS — H5203 Hypermetropia, bilateral: Secondary | ICD-10-CM | POA: Diagnosis not present

## 2019-11-18 ENCOUNTER — Ambulatory Visit (INDEPENDENT_AMBULATORY_CARE_PROVIDER_SITE_OTHER): Payer: Medicare HMO | Admitting: Physician Assistant

## 2019-11-18 ENCOUNTER — Encounter: Payer: Self-pay | Admitting: Physician Assistant

## 2019-11-18 ENCOUNTER — Other Ambulatory Visit: Payer: Self-pay

## 2019-11-18 VITALS — BP 110/76 | HR 72 | Temp 97.9°F | Ht 68.0 in | Wt 190.0 lb

## 2019-11-18 DIAGNOSIS — K219 Gastro-esophageal reflux disease without esophagitis: Secondary | ICD-10-CM

## 2019-11-18 DIAGNOSIS — R140 Abdominal distension (gaseous): Secondary | ICD-10-CM

## 2019-11-18 DIAGNOSIS — R143 Flatulence: Secondary | ICD-10-CM

## 2019-11-18 NOTE — Patient Instructions (Signed)
If you are age 80 or older, your body mass index should be between 23-30. Your Body mass index is 28.89 kg/m. If this is out of the aforementioned range listed, please consider follow up with your Primary Care Provider.  If you are age 37 or younger, your body mass index should be between 19-25. Your Body mass index is 28.89 kg/m. If this is out of the aformentioned range listed, please consider follow up with your Primary Care Provider.   Try Gas-X before meals.  (this is over-the-counter)  Try Gas Prevention Diet.  Call if not better over the the next 1-2 weeks.  Thank you for choosing me and [facility].    Ellouise Newer, PA-C

## 2019-11-18 NOTE — Progress Notes (Signed)
Chief Complaint: Gas and bloating  HPI:    Mr. Kristopher [name] is an 78 year old male with a past medical history as listed below, known to Dr. Carlean Purl, who presents clinic today with a complaint of gas and bloating.    08/15/2019 19 breath hydrogen test was positive and patient was treated for SIBO.    01/27/2019 seen in clinic by Dr. Carlean Purl for dysphagia.  At that time started on Pantoprazole 40 mg daily and stopped famotidine.  Also set up for EGD.    02/02/2019 EGD with esophageal plaques consistent with candidiasis as well as erosive gastropathy.  Patient was started on fluconazole 100 mg p.o. daily x3 weeks.  Patient was supposed to take his Pantoprazole 40 for 2 months and then stop.    06/18/2019 patient wanted to continue on Pantoprazole and requested a refill.  Refill sent in for 1 year.    Today, the patient presents to clinic accompanied by his wife and tells me that he only has very occasional breakthrough heartburn and reflux symptoms using his Pantoprazole 40 mg daily 30 minutes before breakfast.  Also his dysphagia is gone after being treated for candidiasis.  His major complaint today is of a lot of gas.  Tells me that he has gas and rumbling in his stomach most times throughout the day and can sometimes have very explosive bowel movements.  This is also associated with very occasional upper abdominal pain.  Occasionally will have 3-4 looser stools a day accompanied by gas and then other days he will be normal.  Not sure what brings this on but tells me over the past 3 days it has been somewhat better.  Has all been going on for about 6 months.  Recalls being treated for SIBO and feels like maybe the symptoms were slightly better after that.  Unsure if he is eating anything which is causing the symptoms.  He has never kept a food journal.    Denies fever, chills, blood in his stool, weight loss, nausea or vomiting.  Past Medical History:  Diagnosis Date  . Allergic rhinitis   . Biceps tendon  rupture   . C6 radiculopathy   . Candida esophagitis ([place])    after prednsone Tx  . Cataract   . Colon adenoma   . ED (erectile dysfunction)   . Esophageal reflux   . Frequent headaches   . Hyperglyceridemia   . Lateral epicondylitis   . Lumbar radiculopathy   . OA (osteoarthritis)   . Plantar fasciitis   . 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 COLONOSCOPY     x 2  . COLONOSCOPY WITH PROPOFOL N/A 03/05/2017   Procedure: COLONOSCOPY WITH PROPOFOL;  Surgeon: Garlan Fair, MD;  Location: [facility];  Service: Endoscopy;  Laterality: N/A;  . EYE SURGERY    . LUMBAR LAMINECTOMY  1993  . TONSILLECTOMY  age 62    Current Outpatient Medications  Medication Sig Dispense Refill  . Artificial Tear Solution (SOOTHE XP OP) Apply 1 drop to eye daily as needed (dry eyes).    . Cyanocobalamin (VITAMIN B-12 PO) Take 1 tablet by mouth daily.    . diphenhydramine-acetaminophen (TYLENOL PM) 25-500 MG TABS tablet Take 2 tablets by mouth at bedtime.     Marland Kitchen EPINEPHrine 0.3 mg/0.3 mL IJ SOAJ injection     . fluconazole (DIFLUCAN) 100 MG tablet  Take 1 tablet (100 mg total) by mouth daily. 200 mg on first day 22 tablet 0  . fluticasone (FLONASE) 50 MCG/ACT nasal spray Place 2 sprays into both nostrils at bedtime.     . pantoprazole (PROTONIX) 40 MG tablet Take 1 tablet (40 mg total) by mouth daily before breakfast. 90 tablet 3   No current facility-administered medications for this visit.     Allergies as of 11/18/2019 - Review Complete 02/02/2019  Allergen Reaction Noted  . Fire Manufacturing systems engineer  06/21/2017  . Wasp venom  06/21/2017    Family History  Problem Relation Age of Onset  . COPD Father   . Congestive Heart Failure Father   . CAD Father   . Dementia Father   . Pancreatic cancer Mother   . GER disease Mother   . Anxiety disorder Mother   . Glaucoma Maternal Aunt   .  Colon cancer Maternal Uncle   . Asthma Neg Hx   . Eczema Neg Hx   . Urticaria Neg Hx   . Immunodeficiency Neg Hx   . Angioedema Neg Hx   . Allergic rhinitis Neg Hx     Social History   Socioeconomic History  . Marital status: Married    Spouse name: Not on file  . Number of children: Not on file  . Years of education: Not on file  . Highest education level: Not on file  Occupational History  . Not on file  Social Needs  . Financial resource strain: Not on file  . Food insecurity    Worry: Not on file    Inability: Not on file  . Transportation needs    Medical: Not on file    Non-medical: Not on file  Tobacco Use  . Smoking status: Never Smoker  . Smokeless tobacco: Never Used  Substance and Sexual Activity  . Alcohol use: No  . Drug use: No  . Sexual activity: Yes  Lifestyle  . Physical activity    Days per week: Not on file    Minutes per session: Not on file  . Stress: Not on file  Relationships  . Social Herbalist on phone: Not on file    Gets together: Not on file    Attends religious service: Not on file    Active member of club or organization: Not on file    Attends meetings of clubs or organizations: Not on file    Relationship status: Not on file  . Intimate partner violence    Fear of current or ex partner: Not on file    Emotionally abused: Not on file    Physically abused: Not on file    Forced sexual activity: Not on file  Other Topics Concern  . Not on file  Social History Narrative   He is married he has a son and a daughter and grandchildren and they are in the area.  [place] but is been in his family for 200+ years I think.   He is a retired Therapist, music.   He does not smoke use tobacco ethanol or drugs.  He is a remote history of short-term tobacco chewing.    Review of Systems:    Constitutional: No weight loss, fever or chills Cardiovascular: No chest pain  Respiratory: No SOB   Gastrointestinal: See HPI and otherwise negative   Physical Exam:  Vital signs: BP 110/76   Pulse 72   Temp 97.9 F (36.6  C)   Ht 5\' 8"  (1.727 m)   Wt 190 lb (86.2 kg)   BMI 28.89 kg/m   Constitutional:   Pleasant Caucasian male appears to be in NAD, Well developed, Well nourished, alert and cooperative Respiratory: Respirations even and unlabored. Lungs clear to auscultation bilaterally.   No wheezes, crackles, or rhonchi.  Cardiovascular: Normal S1, S2. No MRG. Regular rate and rhythm. No peripheral edema, cyanosis or pallor.  Gastrointestinal:  Soft, nondistended, nontender. No rebound or guarding. Normal bowel sounds. No appreciable masses or hepatomegaly. Rectal:  Not performed.  Psychiatric: Demonstrates good judgement and reason without abnormal affect or behaviors.  No recent labs.  Assessment: 1.  Gas and bloating: Continues with a lot of gas and bloating over the past 6 months with sometimes explosive bowel movements and sometimes looser stools than others, positive for SIBO in the past and treated with Xifaxan in September of last year; consider diet versus medicines versus SIBO 2.  GERD: Moderately controlled on pantoprazole 40 mg daily  Plan: 1.  Discussed symptoms with the patient and his wife.  Explained that likely this is not candidiasis anymore as patient is not having any of those symptoms. 2.  We will try conservative measures first.  Provided him with a gas decreasing diet handout.  Also recommend he start Gas-X one before every meal. 3.  Instructed he and/or his wife to call our clinic in a week if he sees no benefit in this, at that time would recommend that we retreat him for suspected SIBO with Xifaxan. 4.  Continue Pantoprazole 40 mg daily. 5.  Patient will follow in clinic with [place] as needed in the future.  Ellouise Newer, PA-C [facility] 11/18/2019, 1:23 PM  Cc: Keane Scrape*

## 2019-11-30 ENCOUNTER — Telehealth: Payer: Self-pay | Admitting: Internal Medicine

## 2019-11-30 NOTE — Telephone Encounter (Signed)
Patient and his wife report report that despite gas-x his bloating has not improved.  He would like to proceed with retreatment for SIBO as discussed at the last OV.  Please advise

## 2019-12-01 ENCOUNTER — Telehealth: Payer: Self-pay

## 2019-12-01 MED ORDER — RIFAXIMIN 550 MG PO TABS: 550.0000 mg | ORAL_TABLET | Freq: Three times a day (TID) | ORAL | 0 refills | Status: DC

## 2019-12-01 NOTE — Telephone Encounter (Signed)
Xifaxan 550 mg tid x 14 days

## 2019-12-01 NOTE — Telephone Encounter (Signed)
Patient notified of the results and recommendations.  rx sent 

## 2019-12-01 NOTE — Telephone Encounter (Signed)
I did a prior authorization for patient's xifaxan thru covermymeds. It was approved but when I called the pharmacy they said it would be $675.66. I will check tomorrow to see what other options we have.

## 2019-12-02 NOTE — Telephone Encounter (Signed)
We were able to get  the patient samples of the xifaxan and they will come tomorrow to pick it up. They were very appreciative.

## 2019-12-14 ENCOUNTER — Telehealth: Payer: Self-pay | Admitting: Internal Medicine

## 2019-12-14 NOTE — Telephone Encounter (Signed)
Any other recommendations Dr. Carlean Purl?   Thanks-JLL

## 2019-12-14 NOTE — Telephone Encounter (Signed)
Called patient back and spoke to him  And his wife. States the Xifaxan has not help this time.He is having lower abdominal pain, bloating and a lot of gas. Every couple of days he has 2-4 blowout BMs. No blood, no mucus. He gets some relief with Milanta. Said he would like Anderson Malta and Dr. Carlean Purl to review this and advise

## 2019-12-14 NOTE — Telephone Encounter (Signed)
Patient just saw Ellouise Newer, PA

## 2019-12-15 NOTE — Telephone Encounter (Signed)
Called patient and spoke to him and his wife. Gave them Dr. Celesta Aver recommendations and they agreed. Will call us in 2 weeks and let us know how he is doing. Office visit scheduled with Dr. Carlean Purl on 01/15/20

## 2019-12-15 NOTE — Telephone Encounter (Addendum)
Esther  Please call Mr. Hohn and tell him Anderson Malta and I reviewed things.  Sometimes it is possible that pantoprazole may cause these side effects.  I would like him to stop that and use Mylanta as needed if he gets heartburn and also Pepcic (famotidine) 20 mg as needed for heartburn  Advise him that sometimes people ge a lot of heartburn when they stop pantoprazole but over 1-2 weeks that should get better and this is a trial to see if it causing the bowel and gas symptoms and he should know that we might need to replace it with another medication.  The other step might be to retest for SIBO with a breath test to see if that has been fixed 9and if so would tell us that SIBO not his problem). Wait to see what the holding of pantoprazole does.  Tell him to let us know in 2 weeks if the symptoms have gone away.  He should also have a f/u with me if not on the books yet (or [place])

## 2019-12-18 NOTE — Telephone Encounter (Signed)
They are wondering is he to take the Gaviscon he got as they didn't have Mylanta at the same time he takes the Pepcid 20mg  since they are the same thing or does he try one at a time. Please advise and I will call him back.

## 2019-12-18 NOTE — Telephone Encounter (Signed)
Gaviscon and pepcid together are fine   Gaviscon works immediately and Pepcid kicks in later

## 2019-12-18 NOTE — Telephone Encounter (Signed)
I left them a detailed message and told them to call us back with any other questions.

## 2019-12-18 NOTE — Telephone Encounter (Signed)
Pt called back and has questions regarding which OTC meds was recommended by Dr. Carlean Purl.

## 2020-01-15 ENCOUNTER — Other Ambulatory Visit (INDEPENDENT_AMBULATORY_CARE_PROVIDER_SITE_OTHER): Payer: Medicare HMO

## 2020-01-15 ENCOUNTER — Encounter: Payer: Self-pay | Admitting: Internal Medicine

## 2020-01-15 ENCOUNTER — Ambulatory Visit: Payer: Medicare HMO | Admitting: Internal Medicine

## 2020-01-15 ENCOUNTER — Other Ambulatory Visit: Payer: Self-pay

## 2020-01-15 VITALS — BP 100/70 | HR 72 | Temp 98.4°F | Ht 68.0 in | Wt 194.0 lb

## 2020-01-15 DIAGNOSIS — K219 Gastro-esophageal reflux disease without esophagitis: Secondary | ICD-10-CM | POA: Diagnosis not present

## 2020-01-15 DIAGNOSIS — K6389 Other specified diseases of intestine: Secondary | ICD-10-CM

## 2020-01-15 DIAGNOSIS — R195 Other fecal abnormalities: Secondary | ICD-10-CM

## 2020-01-15 DIAGNOSIS — K222 Esophageal obstruction: Secondary | ICD-10-CM

## 2020-01-15 DIAGNOSIS — R140 Abdominal distension (gaseous): Secondary | ICD-10-CM

## 2020-01-15 LAB — IGA: IgA: 148 mg/dL (ref 68–378)

## 2020-01-15 MED ORDER — PANTOPRAZOLE SODIUM 20 MG PO TBEC: 20.0000 mg | DELAYED_RELEASE_TABLET | Freq: Every day | ORAL | 3 refills | Status: DC

## 2020-01-15 MED ORDER — AMOXICILLIN-POT CLAVULANATE 875-125 MG PO TABS: 1.0000 | ORAL_TABLET | Freq: Two times a day (BID) | ORAL | 0 refills | Status: DC

## 2020-01-15 MED ORDER — METRONIDAZOLE 500 MG PO TABS: 500.0000 mg | ORAL_TABLET | Freq: Three times a day (TID) | ORAL | 0 refills | Status: DC

## 2020-01-15 NOTE — Patient Instructions (Signed)
Your provider has requested that you go to the basement level for lab work before leaving today. Press "B" on the elevator. The lab is located at the first door on the left as you exit the elevator.   We have sent the following medications to your pharmacy for you to pick up at your convenience: Pantoprazole , antibiotics ( call us back with an update after you finish the antibiotics)  Stop the pepcid, okay to continue the Gaviscon as needed.  Eat low gas foods. We are giving you a handout on FODMAP information today to read.   I appreciate the opportunity to care for you. Silvano Rusk, MD, [facility]

## 2020-01-15 NOTE — Progress Notes (Signed)
Kristopher [name] 79 y.o. May 20, 1941 [id number]  Assessment & Plan:   Encounter Diagnoses  Name Primary?  . Small intestinal bacterial overgrowth Yes  . Bloating   . Loose stools   . GERD with stricture      We will go ahead and treat with Augmentin and metronidazole for 10 days to see if this relieves his symptoms since he has had a positive breath test for small intestinal bacterial overgrowth  Resume pantoprazole 20 mg daily this time he may not need 40 mg dose.  He is to contact me with results of this therapeutic trial.  If he has persistent problems makes me wonder if it small intestinal bacterial overgrowth, but at that point I would likely repeat his breath test.  I appreciate the opportunity to care for this patient. CC: Reita Cliche, MD   Subjective:   Chief Complaint: "Angry got"  HPI The patient is here for follow-up, his wife is in attendance.  He has a diagnosis of small intestinal bacterial overgrowth and has been having problems with bloating and gaseous stools and loose stools at times.  He had seen Ellouise Newer in December.  We treated him with Xifaxan but he did not get any benefit.  Previously treated for a positive small intestinal bacterial overgrowth test in 2019 and he had a better result but apparently it was only short-lived.  I had him hold his pantoprazole thinking that might have been causing some loose stools.  That did not change anything.  Though he is here and has complained about this multiple times, he says "it is not a big problem".  His wife then interjects and says "but you complained about it frequently".  He does say "I can deal with this if I need to".  He would like treatment however.  Wt Readings from Last 3 Encounters:  01/15/20 194 lb (88 kg)  11/18/19 190 lb (86.2 kg)  02/02/19 204 lb (92.5 kg)    Allergies  Allergen Reactions  . Fire Ant     Burning and itching all over  . Wasp Venom    Current Meds  Medication Sig   . ALPRAZolam (XANAX) 0.5 MG tablet Take 1 tablet by mouth at bedtime as needed.  Marland Kitchen aluminum hydroxide-magnesium carbonate (GAVISCON) 95-358 MG/15ML SUSP Take 15 mLs by mouth as needed.  . Artificial Tear Solution (SOOTHE XP OP) Apply 1 drop to eye daily as needed (dry eyes).  . Cyanocobalamin (VITAMIN B-12 PO) Take 1 tablet by mouth daily.  . diphenhydramine-acetaminophen (TYLENOL PM) 25-500 MG TABS tablet Take 2 tablets by mouth at bedtime.   Marland Kitchen EPINEPHrine 0.3 mg/0.3 mL IJ SOAJ injection   . famotidine (PEPCID) 20 MG tablet Take 20 mg by mouth 2 (two) times daily.  . fluticasone (FLONASE) 50 MCG/ACT nasal spray Place 2 sprays into both nostrils at bedtime.    Past Medical History:  Diagnosis Date  . Allergic rhinitis   . Biceps tendon rupture   . C6 radiculopathy   . Candida esophagitis ([place])    after prednsone Tx  . Cataract   . Colon adenoma   . ED (erectile dysfunction)   . Esophageal reflux   . Frequent headaches   . Hyperglyceridemia   . Lateral epicondylitis   . Lumbar radiculopathy   . OA (osteoarthritis)   . Plantar fasciitis   . 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 COLONOSCOPY     x 2  . COLONOSCOPY WITH PROPOFOL N/A 03/05/2017   Procedure: COLONOSCOPY WITH PROPOFOL;  Surgeon: Garlan Fair, MD;  Location: [facility];  Service: Endoscopy;  Laterality: N/A;  . EYE SURGERY    . LUMBAR LAMINECTOMY  1993  . TONSILLECTOMY  age 15   Social History   Social History Narrative   He is married he has a son and a daughter and grandchildren and they are in the area.  [place] land but is been in his family for 200+ years I think.   He is a retired Therapist, music.   He does not smoke use tobacco ethanol or drugs.  He is a remote history of short-term tobacco chewing.   family history includes Anxiety disorder in his mother; CAD  in his father; COPD in his father; Colon cancer in his maternal uncle; Congestive Heart Failure in his father; Dementia in his father; GER disease in his mother; Glaucoma in his maternal aunt; Pancreatic cancer in his mother.   Review of Systems  As above Objective:   Physical Exam  BP 100/70   Pulse 72   Temp 98.4 F (36.9 C)   Ht 5\' 8"  (1.727 m)   Wt 194 lb (88 kg)   BMI 29.50 kg/m

## 2020-01-18 LAB — TISSUE TRANSGLUTAMINASE, IGA: (tTG) Ab, IgA: 1 U/mL

## 2020-07-30 ENCOUNTER — Other Ambulatory Visit: Payer: Self-pay | Admitting: Internal Medicine

## 2020-08-01 ENCOUNTER — Other Ambulatory Visit: Payer: Self-pay | Admitting: Internal Medicine

## 2020-08-03 ENCOUNTER — Telehealth: Payer: Self-pay | Admitting: Internal Medicine

## 2020-08-03 MED ORDER — PANTOPRAZOLE SODIUM 20 MG PO TBEC: 20.0000 mg | DELAYED_RELEASE_TABLET | Freq: Every day | ORAL | 3 refills | Status: AC

## 2020-08-03 NOTE — Telephone Encounter (Signed)
I have refilled his pantoprazole and left his wife Vaughan Basta a message that this has been done.

## 2021-08-08 ENCOUNTER — Other Ambulatory Visit: Payer: Self-pay | Admitting: Internal Medicine

## 2021-09-09 ENCOUNTER — Emergency Department (HOSPITAL_COMMUNITY)
Admission: EM | Admit: 2021-09-09 | Discharge: 2021-09-09 | Disposition: A | Discharge status: Home / Self Care | Payer: Medicare HMO | Attending: Emergency Medicine | Admitting: Emergency Medicine

## 2021-09-09 ENCOUNTER — Encounter (HOSPITAL_COMMUNITY): Payer: Self-pay | Admitting: Emergency Medicine

## 2021-09-09 ENCOUNTER — Other Ambulatory Visit: Payer: Self-pay

## 2021-09-09 DIAGNOSIS — M5441 Lumbago with sciatica, right side: Secondary | ICD-10-CM | POA: Insufficient documentation

## 2021-09-09 DIAGNOSIS — M48061 Spinal stenosis, lumbar region without neurogenic claudication: Secondary | ICD-10-CM | POA: Insufficient documentation

## 2021-09-09 DIAGNOSIS — M4800 Spinal stenosis, site unspecified: Secondary | ICD-10-CM

## 2021-09-09 DIAGNOSIS — M5431 Sciatica, right side: Secondary | ICD-10-CM

## 2021-09-09 DIAGNOSIS — M545 Low back pain, unspecified: Secondary | ICD-10-CM | POA: Diagnosis present

## 2021-09-09 DIAGNOSIS — R29898 Other symptoms and signs involving the musculoskeletal system: Secondary | ICD-10-CM

## 2021-09-09 LAB — BASIC METABOLIC PANEL
Anion gap: 10 (ref 5–15)
BUN: 20 mg/dL (ref 8–23)
CO2: 23 mmol/L (ref 22–32)
Calcium: 8.9 mg/dL (ref 8.9–10.3)
Chloride: 104 mmol/L (ref 98–111)
Creatinine, Ser: 1.15 mg/dL (ref 0.61–1.24)
GFR, Estimated: >60 mL/min (ref >60)
Glucose, Bld: 90 mg/dL (ref 70–99)
Potassium: 4.8 mmol/L (ref 3.5–5.1)
Sodium: 137 mmol/L (ref 135–145)

## 2021-09-09 LAB — CBC WITH DIFFERENTIAL/PLATELET
Abs Immature Granulocytes: 0.05 10*3/uL (ref 0.00–0.07)
Basophils Absolute: 0 10*3/uL (ref 0.0–0.1)
Basophils Relative: 0 %
Eosinophils Absolute: 0.5 10*3/uL (ref 0.0–0.5)
Eosinophils Relative: 4 %
HCT: 43.4 % (ref 39.0–52.0)
Hemoglobin: 14.6 g/dL (ref 13.0–17.0)
Immature Granulocytes: 1 %
Lymphocytes Relative: 36 %
Lymphs Abs: 4 10*3/uL (ref 0.7–4.0)
MCH: 34.3 pg — ABNORMAL HIGH (ref 26.0–34.0)
MCHC: 33.6 g/dL (ref 30.0–36.0)
MCV: 101.9 fL — ABNORMAL HIGH (ref 80.0–100.0)
Monocytes Absolute: 1.2 10*3/uL — ABNORMAL HIGH (ref 0.1–1.0)
Monocytes Relative: 11 %
Neutro Abs: 5.4 10*3/uL (ref 1.7–7.7)
Neutrophils Relative %: 48 %
Platelets: 256 10*3/uL (ref 150–400)
RBC: 4.26 MIL/uL (ref 4.22–5.81)
RDW: 13.2 % (ref 11.5–15.5)
WBC: 11.1 10*3/uL — ABNORMAL HIGH (ref 4.0–10.5)
nRBC: 0 % (ref 0.0–0.2)

## 2021-09-09 MED ORDER — DEXAMETHASONE SODIUM PHOSPHATE 10 MG/ML IJ SOLN
10.0000 mg | Freq: Once | INTRAMUSCULAR | Status: AC
  Administered 2021-09-09: 10 mg via INTRAVENOUS
  Filled 2021-09-09: qty 1

## 2021-09-09 MED ORDER — MORPHINE SULFATE (PF) 4 MG/ML IV SOLN
4.0000 mg | Freq: Once | INTRAVENOUS | Status: AC
Start: 2021-09-09 — End: 2021-09-09
  Administered 2021-09-09: 4 mg via INTRAVENOUS
  Filled 2021-09-09: qty 1

## 2021-09-09 MED ORDER — KETOROLAC TROMETHAMINE 30 MG/ML IJ SOLN
30.0000 mg | Freq: Once | INTRAMUSCULAR | Status: AC
  Administered 2021-09-09: 30 mg via INTRAVENOUS
  Filled 2021-09-09: qty 1

## 2021-09-09 NOTE — ED Triage Notes (Signed)
Pt c/o lower right sided back pain that radiates to his groin and through his right leg. States he has similar pain to the left, but pain improved after rehab and epidural injection. Denies fall/injury. Denies changes to bowel/bladder.

## 2021-09-09 NOTE — ED Provider Notes (Signed)
Emergency Medicine Provider Triage Evaluation Note  Audi Wettstein , a 80 y.o. male  was evaluated in triage.  Pt complains of lower back pain with radiation down bilateral legs.  Has a history of chronic back issues with surgery in the 90s.  Had a recent MRI and is scheduled to see a neurosurgeon next week but presents to the emergency department for increased level of pain and weakness in the right leg.  Review of Systems  Positive:  Negative: See above   Physical Exam  BP (!) 163/79 (BP Location: Right Arm)   Pulse (!) 58   Temp 98.2 F (36.8 C) (Oral)   Resp 14   Ht 5\' 8"  (1.727 m)   Wt 90.7 kg   SpO2 96%   BMI 30.41 kg/m  Gen:   Awake, no distress   Resp:  Normal effort  MSK:   Moves extremities without difficulty  Other:  3/5 strength to dorsiflexion in the right foot.  5/5 strength to dorsiflexion on the left foot.  Rest of the lower extremity testing is normal  Medical Decision Making  Medically screening exam initiated at 3:44 PM.  Appropriate orders placed.  Domnic Vantol was informed that the remainder of the evaluation will be completed by another provider, this initial triage assessment does not replace that evaluation, and the importance of remaining in the ED until their evaluation is complete.     Myna Bright [place], PA-C 09/09/21 [place], Ankit, MD 09/10/21 1350

## 2021-09-09 NOTE — Discharge Instructions (Addendum)
Contact a health care provider if: You have pain that: Wakes you up when you are sleeping. Gets worse when you lie down. Is worse than you have experienced in the past. Lasts longer than 4 weeks. You have an unexplained weight loss. Get help right away if: You are not able to control when you urinate or have bowel movements (incontinence). You have: Weakness in your lower back, pelvis, buttocks, or legs that gets worse. Redness or swelling of your back. A burning sensation when you urinate. 

## 2021-09-09 NOTE — ED Provider Notes (Signed)
Kristopher [name]   CSN: [id number] Arrival date & time: 09/09/21  1418     History Chief Complaint  Patient presents with   Back Pain    Kristopher [name] is a 80 y.o. male is here with c/o R leg pain.    He has been battling severe back pain and neuropathy since June. Pain was on the Left-  pt and it improved.  He was seen by Melina Schools for that pain in the past and had a epidural injection by Dr. Nelva Bush for his right leg.  His right leg began having pain during rehab and since that time it has been progressively worsening. Patient states that he is constantly in pain and has 10 out of 10 pain anytime he tries to ambulate or stand on the leg. It is improved somewhat with rest. Pain is deep and aching and sharp and electrical when he tries to move or stand. He is able to ambulate but can only take a few steps at a time due to severity of pain.  His wife states that they are very frustrated because he has a very poor quality of life and they are unable to do much because he is so limited. This week he has frequently been in tears which is extremely upsetting to his wife and she states very uncharacteristic for the patient. He has had some episode of his R leg giving out. He has tried gabapentin, NSAIDS, and oxycodone with little relief.  He had and MRI one month ago that showed severe right foraminal narrowing medially with impingement and cranial dorsal displacement of the exiting right L5 nerve root as well as other degenerative changes throughout the lumbar region.  Patient does not have any ambulatory aids at home but did ask his wife if she could get him some crutches today because his pain was so severe.  He denies any red flag symptoms such as loss of bowel or bladder continence, numbness or tingling.  Back Pain     Past Medical History:  Diagnosis Date   Allergic rhinitis    Biceps tendon rupture    C6 radiculopathy    Candida  esophagitis (HCC)    after prednsone Tx   Cataract    Colon adenoma    ED (erectile dysfunction)    Esophageal reflux    Frequent headaches    Hyperglyceridemia    Lateral epicondylitis    Lumbar radiculopathy    OA (osteoarthritis)    Plantar fasciitis    Small intestinal bacterial overgrowth 08/20/2018   + lactulose H2 breath test 08/2018 Try Xifaxan Tx    Patient Active Problem List   Diagnosis Date Noted   Small intestinal bacterial overgrowth 08/20/2018   Allergic reaction to insect sting 06/21/2017   Toxic effect of venom of ants, unintentional 05/27/2017   GERD without esophagitis 03/27/2017   Allergy to insect bites 03/26/2017   Other allergic rhinitis 03/26/2017   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   Prediabetes 11/26/2016   Diverticulosis of large intestine 02/15/2016   History of adenomatous polyp of colon 11/21/2015    Past Surgical History:  Procedure Laterality Date   COLONOSCOPY     x 2   COLONOSCOPY WITH PROPOFOL N/A 03/05/2017   Procedure: COLONOSCOPY WITH PROPOFOL;  Surgeon: Garlan Fair, MD;  Location: [facility];  Service: Endoscopy;  Laterality: N/A;   EYE SURGERY     LUMBAR LAMINECTOMY  89   TONSILLECTOMY  age  60       Family History  Problem Relation Age of Onset   COPD Father    Congestive Heart Failure Father    CAD Father    Dementia Father    Pancreatic cancer Mother    GER disease Mother    Anxiety disorder Mother    Glaucoma Maternal Aunt    Colon cancer Maternal Uncle    Asthma Neg Hx    Eczema Neg Hx    Urticaria Neg Hx    Immunodeficiency Neg Hx    Angioedema Neg Hx    Allergic rhinitis Neg Hx     Social History   Tobacco Use   Smoking status: Never   Smokeless tobacco: Never  Vaping Use   Vaping Use: Never used  Substance Use Topics   Alcohol use: No   Drug use: No    Home Medications Prior to Admission medications   Medication Sig Start Date End Date Taking? Authorizing Provider  ALPRAZolam Duanne Moron) 0.5 MG  tablet Take 1 tablet by mouth at bedtime as needed. 10/20/19   [provider]  aluminum hydroxide-magnesium carbonate (GAVISCON) 95-358 MG/15ML SUSP Take 15 mLs by mouth as needed.    [provider]  amoxicillin-clavulanate (AUGMENTIN) 875-125 MG tablet Take 1 tablet by mouth 2 (two) times daily. 01/15/20   Gatha Mayer, MD  Artificial Tear Solution (SOOTHE XP OP) Apply 1 drop to eye daily as needed (dry eyes).    [provider]  Cyanocobalamin (VITAMIN B-12 PO) Take 1 tablet by mouth daily.    [provider]  diphenhydramine-acetaminophen (TYLENOL PM) 25-500 MG TABS tablet Take 2 tablets by mouth at bedtime.     [provider]  EPINEPHrine 0.3 mg/0.3 mL IJ SOAJ injection  05/29/17   [provider]  fluticasone (FLONASE) 50 MCG/ACT nasal spray Place 2 sprays into both nostrils at bedtime.     [provider]  metroNIDAZOLE (FLAGYL) 500 MG tablet Take 1 tablet (500 mg total) by mouth 3 (three) times daily. 01/15/20   Gatha Mayer, MD  pantoprazole (PROTONIX) 20 MG tablet Take 1 tablet (20 mg total) by mouth daily before breakfast. 08/03/20   Gatha Mayer, MD    Allergies    Fire ant and Wasp venom  Review of Systems   Review of Systems  Musculoskeletal:  Positive for back pain.  Ten systems reviewed and are negative for acute change, except as noted in the HPI.   Physical Exam Updated Vital Signs BP (!) 169/96   Pulse (!) 59   Temp 98.2 F (36.8 C) (Oral)   Resp 18   Ht 5\' 8"  (1.727 m)   Wt 90.7 kg   SpO2 98%   BMI 30.41 kg/m   Physical Exam Vitals and nursing [name] reviewed.  Constitutional:      General: He is not in acute distress.    Appearance: He is well-developed. He is not diaphoretic.  HENT:     Head: Normocephalic and atraumatic.  Eyes:     General: No scleral icterus.    Conjunctiva/sclera: Conjunctivae normal.  Cardiovascular:     Rate and Rhythm: Normal rate and regular rhythm.     Heart  sounds: Normal heart sounds.  Pulmonary:     Effort: Pulmonary effort is normal. No respiratory distress.     Breath sounds: Normal breath sounds.  Abdominal:     Palpations: Abdomen is soft.     Tenderness: There is no abdominal tenderness.  Musculoskeletal:     Cervical back: Normal range of motion and neck supple.  Skin:    General: Skin is warm and dry.  Neurological:     Mental Status: He is alert.  Psychiatric:        Behavior: Behavior normal.    ED Results / Procedures / Treatments   Labs (all labs ordered are listed, but only abnormal results are displayed) Labs Reviewed  CBC WITH DIFFERENTIAL/PLATELET - Abnormal; Notable for the following components:      Result Value   WBC 11.1 (*)    MCV 101.9 (*)    MCH 34.3 (*)    Monocytes Absolute 1.2 (*)    All other components within normal limits  BASIC METABOLIC PANEL    EKG None  Radiology No results found.  Procedures Procedures   Medications Ordered in ED Medications  dexamethasone (DECADRON) injection 10 mg (10 mg Intravenous Given 09/09/21 1951)  ketorolac (TORADOL) 30 MG/ML injection 30 mg (30 mg Intravenous Given 09/09/21 1951)  morphine 4 MG/ML injection 4 mg (4 mg Intravenous Given 09/09/21 1958)    ED Course  I have reviewed the triage vital signs and the nursing notes.  Pertinent labs & imaging results that were available during my care of the patient were reviewed by me and considered in my medical decision making (see chart for details).   80 year old male who presents emergency department with acute with acute on chronic back pain and right leg weakness.  MRI performed 1 month ago shows spinal stenosis and nerve impingement.  Case discussed with Dr. Trenton Gammon who recommends Decadron and Toradol, increased pain control, ambulatory support and follow-up with Dr. Dierdre Harness this coming October 10.  Patient is feeling improved after pain control and is agreeable to disposition home at this time.  He has oral  morphine at home along with gabapentin and does have a walker.  Patient is to follow closely with Dr. Vertell Limber.  Discussed return precautions.  He appears appropriate for discharge at this time   MDM Rules/Calculators/A&P                          Final Clinical Impression(s) / ED Diagnoses Final diagnoses:  Sciatica of right side  Weakness of right leg  Spinal stenosis, unspecified spinal region    Rx / DC Orders ED Discharge Orders     None        Margarita Mail, PA-C 09/10/21 1304    Lajean Saver, MD 09/10/21 [phone]

## 2021-09-18 ENCOUNTER — Other Ambulatory Visit: Payer: Self-pay | Admitting: Neurological Surgery

## 2021-09-19 ENCOUNTER — Other Ambulatory Visit: Payer: Self-pay | Admitting: Neurological Surgery

## 2021-09-21 NOTE — Progress Notes (Signed)
Surgical Instructions    Your procedure is scheduled on 09/26/21.  Report to [facility] Main Entrance "A" at 8:40 A.M., then check in with the Admitting office.  Call this number if you have problems the morning of surgery:  [phone]   If you have any questions prior to your surgery date call [phone]: Open Monday-Friday 8am-4pm    Remember:  Do not eat after midnight the night before your surgery  You may drink clear liquids until 7:40 the morning of your surgery.   Clear liquids allowed are: Water, Non-Citrus Juices (without pulp), Carbonated Beverages, Clear Tea, Black Coffee ONLY (NO MILK, CREAM OR POWDERED CREAMER of any kind), and Gatorade    Take these medicines the morning of surgery with A SIP OF WATER:   gabapentin (NEURONTIN)  HYDROmorphone (DILAUDID) pantoprazole (PROTONIX)   As Needed:  pantoprazole (PROTONIX)  EPINEPHrine   As of today, STOP taking any Aspirin (unless otherwise instructed by your surgeon) Aleve, Naproxen, Ibuprofen, Motrin, Advil, Goody's, BC's, all herbal medications, fish oil, and all vitamins.   After your COVID test   You are not required to quarantine however you are required to wear a well-fitting mask when you are out and around people not in your household.  If your mask becomes wet or soiled, replace with a new one.  Wash your hands often with soap and water for 20 seconds or clean your hands with an alcohol-based hand sanitizer that contains at least 60% alcohol.  Do not share personal items.  Notify your provider: if you are in close contact with someone who has COVID  or if you develop a fever of 100.4 or greater, sneezing, cough, sore throat, shortness of breath or body aches.           Do not wear jewelry  Do not wear lotions, powders, colognes, or deodorant. Do not shave 48 hours prior to surgery.  Men may shave face and neck. Do not bring valuables to the hospital.             [facility] is not responsible for any  belongings or valuables.  Do NOT Smoke (Tobacco/Vaping)  24 hours prior to your procedure  If you use a CPAP at night, you may bring your mask for your overnight stay.   Contacts, glasses, hearing aids, dentures or partials may not be worn into surgery, please bring cases for these belongings   For patients admitted to the hospital, discharge time will be determined by your treatment team.   Patients discharged the day of surgery will not be allowed to drive home, and someone needs to stay with them for 24 hours.  NO VISITORS WILL BE ALLOWED IN PRE-OP WHERE PATIENTS ARE PREPPED FOR SURGERY.  ONLY 1 SUPPORT PERSON MAY BE PRESENT IN THE WAITING ROOM WHILE YOU ARE IN SURGERY.  IF YOU ARE TO BE ADMITTED, ONCE YOU ARE IN YOUR ROOM YOU WILL BE ALLOWED TWO (2) VISITORS. 1 (ONE) VISITOR MAY STAY OVERNIGHT BUT MUST ARRIVE TO THE ROOM BY 8pm.  Minor children may have two parents present. Special consideration for safety and communication needs will be reviewed on a case by case basis.  Special instructions:    Oral Hygiene is also important to reduce your risk of infection.  Remember - BRUSH YOUR TEETH THE MORNING OF SURGERY WITH YOUR REGULAR TOOTHPASTE   Kristopher [name]- Preparing For Surgery  Before surgery, you can play an important role. Because skin is not sterile, your skin needs  to be as free of germs as possible. You can reduce the number of germs on your skin by washing with CHG (chlorahexidine gluconate) Soap before surgery.  CHG is an antiseptic cleaner which kills germs and bonds with the skin to continue killing germs even after washing.     Please do not use if you have an allergy to CHG or antibacterial soaps. If your skin becomes reddened/irritated stop using the CHG.  Do not shave (including legs and underarms) for at least 48 hours prior to first CHG shower. It is OK to shave your face.  Please follow these instructions carefully.     Shower the NIGHT BEFORE SURGERY and the MORNING  OF SURGERY with CHG Soap.   If you chose to wash your hair, wash your hair first as usual with your normal shampoo. After you shampoo, rinse your hair and body thoroughly to remove the shampoo.  Then [employer] and genitals (private parts) with your normal soap and rinse thoroughly to remove soap.  After that Use CHG Soap as you would any other liquid soap. You can apply CHG directly to the skin and wash gently with a scrungie or a clean washcloth.   Apply the CHG Soap to your body ONLY FROM THE NECK DOWN.  Do not use on open wounds or open sores. Avoid contact with your eyes, ears, mouth and genitals (private parts). Wash Face and genitals (private parts)  with your normal soap.   Wash thoroughly, paying special attention to the area where your surgery will be performed.  Thoroughly rinse your body with warm water from the neck down.  DO NOT shower/wash with your normal soap after using and rinsing off the CHG Soap.  Pat yourself dry with a CLEAN TOWEL.  Wear CLEAN PAJAMAS to bed the night before surgery  Place CLEAN SHEETS on your bed the night before your surgery  DO NOT SLEEP WITH PETS.   Day of Surgery:  Take a shower with CHG soap. Wear Clean/Comfortable clothing the morning of surgery Do not apply any deodorants/lotions.   Remember to brush your teeth WITH YOUR REGULAR TOOTHPASTE.   Please read over the following fact sheets that you were given.

## 2021-09-22 ENCOUNTER — Other Ambulatory Visit: Payer: Self-pay

## 2021-09-22 ENCOUNTER — Encounter (HOSPITAL_COMMUNITY): Payer: Self-pay

## 2021-09-22 ENCOUNTER — Encounter (HOSPITAL_COMMUNITY)
Admission: RE | Admit: 2021-09-22 | Discharge: 2021-09-22 | Disposition: A | Discharge status: Home / Self Care | Payer: Medicare HMO | Source: Ambulatory Visit | Attending: Neurological Surgery | Admitting: Neurological Surgery

## 2021-09-22 DIAGNOSIS — Z01812 Encounter for preprocedural laboratory examination: Secondary | ICD-10-CM | POA: Diagnosis not present

## 2021-09-22 DIAGNOSIS — Z20822 Contact with and (suspected) exposure to covid-19: Secondary | ICD-10-CM | POA: Insufficient documentation

## 2021-09-22 HISTORY — DX: Prediabetes: R73.03

## 2021-09-22 LAB — TYPE AND SCREEN
ABO/RH(D): O POS
Antibody Screen: NEGATIVE

## 2021-09-22 LAB — HEMOGLOBIN A1C
Hgb A1c MFr Bld: 6.7 % — ABNORMAL HIGH (ref 4.8–5.6)
Mean Plasma Glucose: 145.59 mg/dL

## 2021-09-22 LAB — SURGICAL PCR SCREEN
MRSA, PCR: NEGATIVE
Staphylococcus aureus: NEGATIVE

## 2021-09-22 LAB — SARS CORONAVIRUS 2 (TAT 6-24 HRS): SARS Coronavirus 2: NEGATIVE

## 2021-09-22 LAB — GLUCOSE, CAPILLARY: Glucose-Capillary: 160 mg/dL — ABNORMAL HIGH (ref 70–99)

## 2021-09-22 NOTE — Progress Notes (Signed)
PCP: Diamond Nickel, MD Cardiologist: denies  EKG: 02/25/16 CXR: 02/13/16 ECHO: 10/21/17 Stress Test: denies Cardiac Cath: denies  Fasting Blood Sugar- na Checks Blood Sugar__na_ times a day  OSA/CPAP: No  ASA/Blood Thinner: No  Covid test 09/22/21  Anesthesia Review: No  Patient denies shortness of breath, fever, cough, and chest pain at PAT appointment.  Patient verbalized understanding of instructions provided today at the PAT appointment.  Patient asked to review instructions at home and day of surgery.

## 2021-09-26 ENCOUNTER — Inpatient Hospital Stay (HOSPITAL_COMMUNITY): Payer: Medicare HMO | Admitting: Certified Registered"

## 2021-09-26 ENCOUNTER — Encounter (HOSPITAL_COMMUNITY)
Admission: RE | Disposition: A | Discharge status: Home / Self Care | Payer: Self-pay | Source: Home / Self Care | Attending: Neurological Surgery

## 2021-09-26 ENCOUNTER — Inpatient Hospital Stay (HOSPITAL_COMMUNITY): Payer: Medicare HMO

## 2021-09-26 ENCOUNTER — Encounter (HOSPITAL_COMMUNITY): Payer: Self-pay | Admitting: Neurological Surgery

## 2021-09-26 ENCOUNTER — Inpatient Hospital Stay (HOSPITAL_COMMUNITY)
Admission: RE | Admit: 2021-09-26 | Discharge: 2021-09-27 | DRG: 460 | Disposition: A | Discharge status: Home / Self Care | Payer: Medicare HMO | Attending: Neurological Surgery | Admitting: Neurological Surgery

## 2021-09-26 DIAGNOSIS — Z419 Encounter for procedure for purposes other than remedying health state, unspecified: Secondary | ICD-10-CM

## 2021-09-26 DIAGNOSIS — M21371 Foot drop, right foot: Secondary | ICD-10-CM | POA: Diagnosis present

## 2021-09-26 DIAGNOSIS — Z800 Family history of malignant neoplasm of digestive organs: Secondary | ICD-10-CM

## 2021-09-26 DIAGNOSIS — M961 Postlaminectomy syndrome, not elsewhere classified: Secondary | ICD-10-CM | POA: Diagnosis present

## 2021-09-26 DIAGNOSIS — Z818 Family history of other mental and behavioral disorders: Secondary | ICD-10-CM

## 2021-09-26 DIAGNOSIS — Z8249 Family history of ischemic heart disease and other diseases of the circulatory system: Secondary | ICD-10-CM

## 2021-09-26 DIAGNOSIS — M4316 Spondylolisthesis, lumbar region: Principal | ICD-10-CM | POA: Diagnosis present

## 2021-09-26 DIAGNOSIS — M5117 Intervertebral disc disorders with radiculopathy, lumbosacral region: Secondary | ICD-10-CM | POA: Diagnosis present

## 2021-09-26 DIAGNOSIS — K219 Gastro-esophageal reflux disease without esophagitis: Secondary | ICD-10-CM | POA: Diagnosis present

## 2021-09-26 DIAGNOSIS — Z79899 Other long term (current) drug therapy: Secondary | ICD-10-CM

## 2021-09-26 DIAGNOSIS — M47817 Spondylosis without myelopathy or radiculopathy, lumbosacral region: Secondary | ICD-10-CM | POA: Diagnosis present

## 2021-09-26 DIAGNOSIS — Z825 Family history of asthma and other chronic lower respiratory diseases: Secondary | ICD-10-CM

## 2021-09-26 DIAGNOSIS — M4807 Spinal stenosis, lumbosacral region: Secondary | ICD-10-CM | POA: Diagnosis present

## 2021-09-26 HISTORY — PX: TRANSFORAMINAL LUMBAR INTERBODY FUSION W/ MIS 2 LEVEL: SHX6146

## 2021-09-26 LAB — ABO/RH: ABO/RH(D): O POS

## 2021-09-26 LAB — GLUCOSE, CAPILLARY: Glucose-Capillary: 99 mg/dL (ref 70–99)

## 2021-09-26 SURGERY — MINIMALLY INVASIVE (MIS) TRANSFORAMINAL LUMBAR INTERBODY FUSION (TLIF) 2 LEVEL
Anesthesia: General | Laterality: Right

## 2021-09-26 MED ORDER — CEFAZOLIN SODIUM-DEXTROSE 2-4 GM/100ML-% IV SOLN
2.0000 g | INTRAVENOUS | Status: AC
  Administered 2021-09-26: 2 g via INTRAVENOUS
  Filled 2021-09-26: qty 100

## 2021-09-26 MED ORDER — SODIUM CHLORIDE 0.9% FLUSH: 3.0000 mL | INTRAVENOUS | Status: DC | PRN

## 2021-09-26 MED ORDER — HYDROMORPHONE HCL 1 MG/ML IJ SOLN
INTRAMUSCULAR | Status: AC
  Filled 2021-09-26: qty 0.5

## 2021-09-26 MED ORDER — HYDROMORPHONE HCL 1 MG/ML IJ SOLN: 1.0000 mg | INTRAMUSCULAR | Status: DC | PRN

## 2021-09-26 MED ORDER — CEFAZOLIN SODIUM-DEXTROSE 2-4 GM/100ML-% IV SOLN
2.0000 g | Freq: Three times a day (TID) | INTRAVENOUS | Status: AC
  Administered 2021-09-26 – 2021-09-27 (×2): 2 g via INTRAVENOUS
  Filled 2021-09-26 (×2): qty 100

## 2021-09-26 MED ORDER — SUGAMMADEX SODIUM 200 MG/2ML IV SOLN
INTRAVENOUS | Status: DC | PRN
  Administered 2021-09-26: 200 mg via INTRAVENOUS

## 2021-09-26 MED ORDER — GABAPENTIN 100 MG PO CAPS
100.0000 mg | ORAL_CAPSULE | Freq: Three times a day (TID) | ORAL | Status: DC
  Administered 2021-09-26 – 2021-09-27 (×2): 100 mg via ORAL
  Filled 2021-09-26 (×2): qty 1

## 2021-09-26 MED ORDER — HYDROMORPHONE HCL 1 MG/ML IJ SOLN
INTRAMUSCULAR | Status: DC | PRN
  Administered 2021-09-26 (×2): .5 mg via INTRAVENOUS

## 2021-09-26 MED ORDER — LIDOCAINE 2% (20 MG/ML) 5 ML SYRINGE
INTRAMUSCULAR | Status: DC | PRN
  Administered 2021-09-26: 60 mg via INTRAVENOUS

## 2021-09-26 MED ORDER — LIDOCAINE-EPINEPHRINE 1 %-1:100000 IJ SOLN
INTRAMUSCULAR | Status: AC
  Filled 2021-09-26: qty 1

## 2021-09-26 MED ORDER — HYDROMORPHONE HCL 1 MG/ML IJ SOLN
0.5000 mg | INTRAMUSCULAR | Status: DC | PRN
  Administered 2021-09-26 (×2): 0.5 mg via INTRAVENOUS

## 2021-09-26 MED ORDER — FENTANYL CITRATE (PF) 250 MCG/5ML IJ SOLN
INTRAMUSCULAR | Status: DC | PRN
  Administered 2021-09-26: 250 ug via INTRAVENOUS

## 2021-09-26 MED ORDER — CHLORHEXIDINE GLUCONATE CLOTH 2 % EX PADS: 6.0000 | MEDICATED_PAD | Freq: Once | CUTANEOUS | Status: DC

## 2021-09-26 MED ORDER — SENNA 8.6 MG PO TABS
1.0000 | ORAL_TABLET | Freq: Two times a day (BID) | ORAL | Status: DC
  Administered 2021-09-26 – 2021-09-27 (×2): 8.6 mg via ORAL
  Filled 2021-09-26 (×2): qty 1

## 2021-09-26 MED ORDER — LACTATED RINGERS IV SOLN: INTRAVENOUS | Status: DC

## 2021-09-26 MED ORDER — THROMBIN 5000 UNITS EX SOLR
CUTANEOUS | Status: AC
  Filled 2021-09-26: qty 5000

## 2021-09-26 MED ORDER — ONDANSETRON HCL 4 MG/2ML IJ SOLN: 4.0000 mg | Freq: Four times a day (QID) | INTRAMUSCULAR | Status: DC | PRN

## 2021-09-26 MED ORDER — ROCURONIUM BROMIDE 10 MG/ML (PF) SYRINGE
PREFILLED_SYRINGE | INTRAVENOUS | Status: AC
  Filled 2021-09-26: qty 10

## 2021-09-26 MED ORDER — SODIUM CHLORIDE 0.9% FLUSH
3.0000 mL | Freq: Two times a day (BID) | INTRAVENOUS | Status: DC
  Administered 2021-09-27: 3 mL via INTRAVENOUS

## 2021-09-26 MED ORDER — ALPRAZOLAM 0.5 MG PO TABS
0.5000 mg | ORAL_TABLET | Freq: Every day | ORAL | Status: DC
  Administered 2021-09-26: 0.5 mg via ORAL
  Filled 2021-09-26: qty 1

## 2021-09-26 MED ORDER — BUPIVACAINE HCL (PF) 0.5 % IJ SOLN
INTRAMUSCULAR | Status: DC | PRN
  Administered 2021-09-26: 10 mL

## 2021-09-26 MED ORDER — BUPIVACAINE HCL (PF) 0.5 % IJ SOLN
INTRAMUSCULAR | Status: AC
  Filled 2021-09-26: qty 30

## 2021-09-26 MED ORDER — ACETAMINOPHEN 10 MG/ML IV SOLN
INTRAVENOUS | Status: DC | PRN
  Administered 2021-09-26: 1000 mg via INTRAVENOUS

## 2021-09-26 MED ORDER — HYDROMORPHONE HCL 1 MG/ML IJ SOLN
INTRAMUSCULAR | Status: AC
  Filled 2021-09-26: qty 1

## 2021-09-26 MED ORDER — OXYCODONE HCL 5 MG/5ML PO SOLN: 5.0000 mg | Freq: Once | ORAL | Status: DC | PRN

## 2021-09-26 MED ORDER — LIDOCAINE 2% (20 MG/ML) 5 ML SYRINGE
INTRAMUSCULAR | Status: AC
  Filled 2021-09-26: qty 5

## 2021-09-26 MED ORDER — PROPOFOL 10 MG/ML IV BOLUS
INTRAVENOUS | Status: DC | PRN
  Administered 2021-09-26: 150 mg via INTRAVENOUS

## 2021-09-26 MED ORDER — ONDANSETRON HCL 4 MG/2ML IJ SOLN
INTRAMUSCULAR | Status: DC | PRN
  Administered 2021-09-26: 4 mg via INTRAVENOUS

## 2021-09-26 MED ORDER — PHENYLEPHRINE HCL-NACL 20-0.9 MG/250ML-% IV SOLN
INTRAVENOUS | Status: DC | PRN
  Administered 2021-09-26: 30 ug/min via INTRAVENOUS

## 2021-09-26 MED ORDER — FENTANYL CITRATE (PF) 250 MCG/5ML IJ SOLN
INTRAMUSCULAR | Status: AC
  Filled 2021-09-26: qty 5

## 2021-09-26 MED ORDER — ACETAMINOPHEN 325 MG PO TABS
650.0000 mg | ORAL_TABLET | ORAL | Status: DC | PRN
  Administered 2021-09-27: 650 mg via ORAL
  Filled 2021-09-26 (×2): qty 2

## 2021-09-26 MED ORDER — PROMETHAZINE HCL 25 MG/ML IJ SOLN: 6.2500 mg | INTRAMUSCULAR | Status: DC | PRN

## 2021-09-26 MED ORDER — CHLORHEXIDINE GLUCONATE 0.12 % MT SOLN
15.0000 mL | OROMUCOSAL | Status: AC
  Filled 2021-09-26: qty 15

## 2021-09-26 MED ORDER — HYDROMORPHONE HCL 2 MG PO TABS
4.0000 mg | ORAL_TABLET | Freq: Three times a day (TID) | ORAL | Status: DC
  Administered 2021-09-26 – 2021-09-27 (×2): 4 mg via ORAL
  Filled 2021-09-26 (×2): qty 2

## 2021-09-26 MED ORDER — ROCURONIUM BROMIDE 10 MG/ML (PF) SYRINGE
PREFILLED_SYRINGE | INTRAVENOUS | Status: DC | PRN
  Administered 2021-09-26 (×2): 30 mg via INTRAVENOUS
  Administered 2021-09-26: 70 mg via INTRAVENOUS
  Administered 2021-09-26: 30 mg via INTRAVENOUS

## 2021-09-26 MED ORDER — HEMOSTATIC AGENTS (NO CHARGE) OPTIME
TOPICAL | Status: DC | PRN
Start: 2021-09-26 — End: 2021-09-26
  Administered 2021-09-26: 1 via TOPICAL

## 2021-09-26 MED ORDER — ALUM HYDROXIDE-MAG CARBONATE 95-358 MG/15ML PO SUSP
15.0000 mL | Freq: Every day | ORAL | Status: DC | PRN
  Filled 2021-09-26: qty 15

## 2021-09-26 MED ORDER — LIDOCAINE-EPINEPHRINE 1 %-1:100000 IJ SOLN
INTRAMUSCULAR | Status: DC | PRN
  Administered 2021-09-26: 10 mL

## 2021-09-26 MED ORDER — EPHEDRINE SULFATE-NACL 50-0.9 MG/10ML-% IV SOSY
PREFILLED_SYRINGE | INTRAVENOUS | Status: DC | PRN
  Administered 2021-09-26: 10 mg via INTRAVENOUS

## 2021-09-26 MED ORDER — HYDROMORPHONE HCL 1 MG/ML IJ SOLN
0.2500 mg | INTRAMUSCULAR | Status: DC | PRN
  Administered 2021-09-26 (×6): 0.5 mg via INTRAVENOUS

## 2021-09-26 MED ORDER — OXYCODONE HCL 5 MG PO TABS
10.0000 mg | ORAL_TABLET | ORAL | Status: DC | PRN
  Administered 2021-09-26 – 2021-09-27 (×2): 10 mg via ORAL
  Filled 2021-09-26 (×2): qty 2

## 2021-09-26 MED ORDER — KETOROLAC TROMETHAMINE 15 MG/ML IJ SOLN
7.5000 mg | Freq: Four times a day (QID) | INTRAMUSCULAR | Status: DC
Start: 2021-09-26 — End: 2021-09-27
  Administered 2021-09-26 – 2021-09-27 (×3): 7.5 mg via INTRAVENOUS
  Filled 2021-09-26 (×3): qty 1

## 2021-09-26 MED ORDER — PHENOL 1.4 % MT LIQD: 1.0000 | OROMUCOSAL | Status: DC | PRN

## 2021-09-26 MED ORDER — DEXAMETHASONE SODIUM PHOSPHATE 10 MG/ML IJ SOLN
INTRAMUSCULAR | Status: DC | PRN
  Administered 2021-09-26: 10 mg via INTRAVENOUS

## 2021-09-26 MED ORDER — 0.9 % SODIUM CHLORIDE (POUR BTL) OPTIME
TOPICAL | Status: DC | PRN
  Administered 2021-09-26: 1000 mL

## 2021-09-26 MED ORDER — CHLORHEXIDINE GLUCONATE 0.12 % MT SOLN
OROMUCOSAL | Status: AC
  Administered 2021-09-26: 15 mL via OROMUCOSAL
  Filled 2021-09-26: qty 15

## 2021-09-26 MED ORDER — OXYCODONE HCL 5 MG PO TABS: 5.0000 mg | ORAL_TABLET | Freq: Once | ORAL | Status: DC | PRN

## 2021-09-26 MED ORDER — MENTHOL 3 MG MT LOZG: 1.0000 | LOZENGE | OROMUCOSAL | Status: DC | PRN

## 2021-09-26 MED ORDER — BUPIVACAINE-EPINEPHRINE (PF) 0.5% -1:200000 IJ SOLN: INTRAMUSCULAR | Status: DC | PRN

## 2021-09-26 MED ORDER — ACETAMINOPHEN 500 MG PO TABS: 1000.0000 mg | ORAL_TABLET | Freq: Once | ORAL | Status: DC

## 2021-09-26 MED ORDER — THROMBIN 5000 UNITS EX SOLR
OROMUCOSAL | Status: DC | PRN
  Administered 2021-09-26: 5 mL via TOPICAL

## 2021-09-26 MED ORDER — SODIUM CHLORIDE 0.9 % IV SOLN: 250.0000 mL | INTRAVENOUS | Status: DC

## 2021-09-26 MED ORDER — ACETAMINOPHEN 10 MG/ML IV SOLN
INTRAVENOUS | Status: AC
  Filled 2021-09-26: qty 100

## 2021-09-26 MED ORDER — OXYCODONE HCL 5 MG PO TABS: 5.0000 mg | ORAL_TABLET | ORAL | Status: DC | PRN

## 2021-09-26 MED ORDER — FLUTICASONE PROPIONATE 50 MCG/ACT NA SUSP
2.0000 | Freq: Every day | NASAL | Status: DC
  Filled 2021-09-26: qty 16

## 2021-09-26 MED ORDER — PROPOFOL 10 MG/ML IV BOLUS
INTRAVENOUS | Status: AC
  Filled 2021-09-26: qty 20

## 2021-09-26 MED ORDER — MIDAZOLAM HCL 2 MG/2ML IJ SOLN: 0.5000 mg | Freq: Once | INTRAMUSCULAR | Status: DC | PRN

## 2021-09-26 MED ORDER — PANTOPRAZOLE SODIUM 20 MG PO TBEC
20.0000 mg | DELAYED_RELEASE_TABLET | Freq: Every day | ORAL | Status: DC
  Administered 2021-09-27: 20 mg via ORAL
  Filled 2021-09-26: qty 1

## 2021-09-26 MED ORDER — ALUM HYDROXIDE-MAG CARBONATE 95-358 MG/15ML PO SUSP: 15.0000 mL | Freq: Every day | ORAL | Status: DC | PRN

## 2021-09-26 MED ORDER — ACETAMINOPHEN 650 MG RE SUPP: 650.0000 mg | RECTAL | Status: DC | PRN

## 2021-09-26 MED ORDER — ONDANSETRON HCL 4 MG PO TABS: 4.0000 mg | ORAL_TABLET | Freq: Four times a day (QID) | ORAL | Status: DC | PRN

## 2021-09-26 MED ORDER — BUPIVACAINE LIPOSOME 1.3 % IJ SUSP
INTRAMUSCULAR | Status: DC | PRN
  Administered 2021-09-26: 20 mL

## 2021-09-26 MED ORDER — BUPIVACAINE LIPOSOME 1.3 % IJ SUSP
INTRAMUSCULAR | Status: AC
  Filled 2021-09-26: qty 20

## 2021-09-26 SURGICAL SUPPLY — 80 items
ADH SKN CLS APL DERMABOND .7 (GAUZE/BANDAGES/DRESSINGS) ×2
BAG BANDED W/RUBBER/TAPE 36X54 (MISCELLANEOUS) ×2 IMPLANT
BAG COUNTER SPONGE SURGICOUNT (BAG) ×4 IMPLANT
BAG EQP BAND 135X91 W/RBR TAPE (MISCELLANEOUS)
BAG SPNG CNTER NS LX DISP (BAG) ×3
BAND INSRT 18 STRL LF DISP RB (MISCELLANEOUS) ×2
BAND RUBBER #18 3X1/16 STRL (MISCELLANEOUS) ×4 IMPLANT
BASKET BONE COLLECTION (BASKET) ×1 IMPLANT
BUR 2.5 MTCH HD 16 (BUR) ×1 IMPLANT
BUR MATCHSTICK NEURO 3.0 LAGG (BURR) ×1 IMPLANT
CAGE CONVEX CASCADIA 8.5X28X7 (Cage) ×1 IMPLANT
CAGE POR CASCADIA 8.5X28X10 (Cage) ×1 IMPLANT
CARTRIDGE OIL MAESTRO DRILL (MISCELLANEOUS) ×1 IMPLANT
CNTNR URN SCR LID CUP LEK RST (MISCELLANEOUS) ×1 IMPLANT
CONT SPEC 4OZ STRL OR WHT (MISCELLANEOUS) ×2
COVER BACK TABLE 60X90IN (DRAPES) ×2 IMPLANT
COVER MAYO STAND STRL (DRAPES) ×3 IMPLANT
DECANTER SPIKE VIAL GLASS SM (MISCELLANEOUS) ×1 IMPLANT
DERMABOND ADVANCED (GAUZE/BANDAGES/DRESSINGS) ×2
DERMABOND ADVANCED .7 DNX12 (GAUZE/BANDAGES/DRESSINGS) IMPLANT
DIFFUSER DRILL AIR PNEUMATIC (MISCELLANEOUS) ×1 IMPLANT
DRAIN JACKSON RD 7FR 3/32 (WOUND CARE) IMPLANT
DRAPE C-ARMOR (DRAPES) IMPLANT
DRAPE LAPAROTOMY 100X72X124 (DRAPES) ×2 IMPLANT
DRAPE MICROSCOPE LEICA (MISCELLANEOUS) ×2 IMPLANT
DRAPE STERI IOBAN 125X83 (DRAPES) ×2 IMPLANT
DRAPE SURG 17X23 STRL (DRAPES) ×2 IMPLANT
DRSG OPSITE POSTOP 4X6 (GAUZE/BANDAGES/DRESSINGS) ×2 IMPLANT
DURAPREP 26ML APPLICATOR (WOUND CARE) ×2 IMPLANT
ELECT BLADE INSULATED 6.5IN (ELECTROSURGICAL) ×2
ELECT REM PT RETURN 9FT ADLT (ELECTROSURGICAL) ×2
ELECTRODE BLDE INSULATED 6.5IN (ELECTROSURGICAL) ×1 IMPLANT
ELECTRODE REM PT RTRN 9FT ADLT (ELECTROSURGICAL) ×1 IMPLANT
GAUZE 4X4 16PLY ~~LOC~~+RFID DBL (SPONGE) ×2 IMPLANT
GAUZE SPONGE 4X4 12PLY STRL (GAUZE/BANDAGES/DRESSINGS) ×2 IMPLANT
GLOVE SRG 8 PF TXTR STRL LF DI (GLOVE) ×2 IMPLANT
GLOVE SURG LTX SZ8 (GLOVE) ×4 IMPLANT
GLOVE SURG UNDER POLY LF SZ8 (GLOVE) ×4
GOWN STRL REUS W/ TWL LRG LVL3 (GOWN DISPOSABLE) IMPLANT
GOWN STRL REUS W/ TWL XL LVL3 (GOWN DISPOSABLE) ×1 IMPLANT
GOWN STRL REUS W/TWL 2XL LVL3 (GOWN DISPOSABLE) IMPLANT
GOWN STRL REUS W/TWL LRG LVL3 (GOWN DISPOSABLE)
GOWN STRL REUS W/TWL XL LVL3 (GOWN DISPOSABLE) ×2
GUIDEWIRE NITI 1.4X495 2-PK (WIRE) ×3 IMPLANT
KIT BASIN OR (CUSTOM PROCEDURE TRAY) ×2 IMPLANT
KIT INFUSE XX SMALL 0.7CC (Orthopedic Implant) ×1 IMPLANT
KIT POSITION SURG JACKSON T1 (MISCELLANEOUS) ×2 IMPLANT
KIT TURNOVER KIT B (KITS) ×2 IMPLANT
MARKER SKIN DUAL TIP RULER LAB (MISCELLANEOUS) ×2 IMPLANT
NDL BIOPSY DD SERENGETI 8G (NEEDLE) IMPLANT
NDL HYPO 21X1.5 SAFETY (NEEDLE) IMPLANT
NDL HYPO 25X1 1.5 SAFETY (NEEDLE) ×1 IMPLANT
NEEDLE BIOPSY DD SERENGETI 8G (NEEDLE) ×4 IMPLANT
NEEDLE HYPO 21X1.5 SAFETY (NEEDLE) ×2 IMPLANT
NEEDLE HYPO 25X1 1.5 SAFETY (NEEDLE) ×2 IMPLANT
NS IRRIG 1000ML POUR BTL (IV SOLUTION) ×2 IMPLANT
OIL CARTRIDGE MAESTRO DRILL (MISCELLANEOUS)
PACK LAMINECTOMY NEURO (CUSTOM PROCEDURE TRAY) ×2 IMPLANT
PAD ARMBOARD 7.5X6 YLW CONV (MISCELLANEOUS) ×6 IMPLANT
PATTIES SURGICAL .5 X.5 (GAUZE/BANDAGES/DRESSINGS) IMPLANT
PATTIES SURGICAL .5 X1 (DISPOSABLE) IMPLANT
PATTIES SURGICAL 1X1 (DISPOSABLE) IMPLANT
PUTTY BONE 100 VESUVIUS 2.5CC (Putty) ×1 IMPLANT
ROD CONT HEX 6X65 (Rod) ×2 IMPLANT
SCREW CANN PA EVEREST 7.5X55 (Screw) ×4 IMPLANT
SCREW PA FENS EVEREST 6.5X50 (Screw) ×2 IMPLANT
SET SCREW (Screw) ×12 IMPLANT
SET SCREW VRST (Screw) IMPLANT
SPONGE SURGIFOAM ABS GEL SZ50 (HEMOSTASIS) ×1 IMPLANT
SPONGE T-LAP 4X18 ~~LOC~~+RFID (SPONGE) ×1 IMPLANT
STAPLER VISISTAT 35W (STAPLE) IMPLANT
SUT VIC AB 0 CT1 27 (SUTURE) ×2
SUT VIC AB 0 CT1 27XBRD ANBCTR (SUTURE) ×1 IMPLANT
SUT VIC AB 2-0 CP2 18 (SUTURE) ×3 IMPLANT
SUT VIC AB 3-0 SH 8-18 (SUTURE) ×2 IMPLANT
SYR 20ML LL LF (SYRINGE) ×1 IMPLANT
TOWEL GREEN STERILE (TOWEL DISPOSABLE) IMPLANT
TOWEL GREEN STERILE FF (TOWEL DISPOSABLE) IMPLANT
TRAY FOLEY MTR SLVR 16FR STAT (SET/KITS/TRAYS/PACK) ×2 IMPLANT
WATER STERILE IRR 1000ML POUR (IV SOLUTION) ×2 IMPLANT

## 2021-09-26 NOTE — Anesthesia Preprocedure Evaluation (Addendum)
Anesthesia Evaluation  Patient identified by MRN, date of birth, ID band Patient awake    Reviewed: Allergy & Precautions, NPO status , Patient's Chart, lab work & pertinent test results  History of Anesthesia Complications Negative for: history of anesthetic complications  Airway Mallampati: IV  TM Distance: >3 FB Neck ROM: Full    Dental  (+) Dental Advisory Given, Chipped   Pulmonary  09/22/2021 SARS coronavirus NEG   breath sounds clear to auscultation       Cardiovascular  Rhythm:Regular Rate:Normal  '18 ECHO: Systolicfunction was normal. EF 55-60%. Wall motion was normal, no regional wall motion abnormalities. grade 1 DD, mild TR   Neuro/Psych  Headaches, Chronic back pain: narcotics    GI/Hepatic Neg liver ROS, GERD  Medicated and Controlled,  Endo/Other  obese  Renal/GU negative Renal ROS     Musculoskeletal  (+) Arthritis ,   Abdominal (+) + obese,   Peds  Hematology negative hematology ROS (+)   Anesthesia Other Findings   Reproductive/Obstetrics                            Anesthesia Physical Anesthesia Plan  ASA: 2  Anesthesia Plan: General   Post-op Pain Management:    Induction: Intravenous  PONV Risk Score and Plan: 2 and Ondansetron and Dexamethasone  Airway Management Planned: Oral ETT and Video Laryngoscope Planned  Additional Equipment: None  Intra-op Plan:   Post-operative Plan: Extubation in OR  Informed Consent: I have reviewed the patients History and Physical, chart, labs and discussed the procedure including the risks, benefits and alternatives for the proposed anesthesia with the patient or authorized representative who has indicated his/her understanding and acceptance.     Dental advisory given  Plan Discussed with: CRNA and Surgeon  Anesthesia Plan Comments:        Anesthesia Quick Evaluation

## 2021-09-26 NOTE — H&P (Signed)
Providing Compassionate, Quality Care - Together  NEUROSURGERY HISTORY & PHYSICAL   Kristopher [name] is an 80 y.o. male.   Chief Complaint: Right lower extremity weakness and pain HPI: This is an 80 year old male with a history of chronic low back pain, right lower extremity radiculopathy and progressive right lower extremity weakness in his foot.  Upon evaluation he was found to have a isthmic spondylolisthesis with severe stenosis in the lateral recess and foramina bilaterally at L4-5 with severe stenosis in the lateral recess and foramina at L5-S1 with severe degenerative disc disease.  Given this and his progressive right lower extremity weakness and difficulty walking, I recommended surgical intervention.  He presents today for this.  He denies any changes in his symptomatology.  Denies any groin numbness.  Continues to have right lower extremity severe radiculopathy currently at 7/10.  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   + lactulose H2 breath test 08/2018 Try Xifaxan Tx    Past Surgical History:  Procedure Laterality Date   COLONOSCOPY     x 2   COLONOSCOPY WITH PROPOFOL N/A 03/05/2017   Procedure: COLONOSCOPY WITH PROPOFOL;  Surgeon: Garlan Fair, MD;  Location: [facility];  Service: Endoscopy;  Laterality: N/A;   EYE SURGERY     LUMBAR LAMINECTOMY  18   TONSILLECTOMY  age 70    Family History  Problem Relation Age of Onset   COPD Father    Congestive Heart Failure Father    CAD Father    Dementia Father    Pancreatic cancer Mother    GER disease Mother     Anxiety disorder Mother    Glaucoma Maternal Aunt    Colon cancer Maternal Uncle    Asthma Neg Hx    Eczema Neg Hx    Urticaria Neg Hx    Immunodeficiency Neg Hx    Angioedema Neg Hx    Allergic rhinitis Neg Hx    Social History:  reports that he has never smoked. He has never used smokeless tobacco. He reports that he does not drink alcohol and does not use drugs.  Allergies:  Allergies  Allergen Reactions   Fire Ant Other (See Comments)    Burning and itching all over   Wasp Venom Other (See Comments)    Burning and itching all over    Medications Prior to Admission  Medication Sig Dispense Refill   ALPRAZolam (XANAX) 0.5 MG tablet Take 0.5 mg by mouth at bedtime.     aluminum hydroxide-magnesium carbonate (GAVISCON) 95-358 MG/15ML SUSP Take 15 mLs by mouth daily as needed for indigestion or heartburn.     Artificial Tear Solution (SOOTHE XP OP) Apply 1 drop to eye daily as needed (dry eyes).     B Complex-C (SUPER B COMPLEX PO) Take 1 tablet by mouth daily.     diphenhydramine-acetaminophen (TYLENOL PM) 25-500 MG TABS tablet Take 2 tablets by mouth at bedtime.      EPINEPHrine 0.3 mg/0.3 mL  IJ SOAJ injection Inject 0.3 mg into the muscle as needed for anaphylaxis.     fluticasone (FLONASE) 50 MCG/ACT nasal spray Place 2 sprays into both nostrils at bedtime.      gabapentin (NEURONTIN) 100 MG capsule Take 100 mg by mouth 3 (three) times daily.     HYDROmorphone (DILAUDID) 4 MG tablet Take 4 mg by mouth 3 (three) times daily.     pantoprazole (PROTONIX) 20 MG tablet Take 1 tablet (20 mg total) by mouth daily before breakfast. 90 tablet 3    Results for orders placed or performed during the hospital encounter of 09/26/21 (from the past 48 hour(s))  Glucose, capillary     Status: None   Collection Time: 09/26/21  8:58 AM  Result Value Ref Range   Glucose-Capillary 99 70 - 99 mg/dL    Comment: Glucose reference range applies only to samples taken after fasting for at least 8  hours.  ABO/Rh     Status: None   Collection Time: 09/26/21  9:15 AM  Result Value Ref Range   ABO/RH(D)      O POS Performed at [facility] [address], [place], [place] [address]    No results found.  ROS All positives and negatives are listed in HPI above  Blood pressure (!) 177/83, pulse 61, temperature 97.8 F (36.6 C), temperature source Oral, resp. rate 17, height 5\' 8"  (1.727 m), weight 90.7 kg, SpO2 97 %. 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  All risks, benefits and expected outcomes, alternatives were discussed and agreed upon with the patient.  I answered all of his questions.    Thank you for allowing me to participate in this patient's care.  Please do not hesitate to call with questions or concerns.   [name], [facility] Cell: [phone]

## 2021-09-26 NOTE — Transfer of Care (Signed)
Immediate Anesthesia Transfer of Care Note  Patient: Kristopher [name]  Procedure(s) Performed: Minimally Invasive Transforaminal Lumbar Interbody Fusion, Lumbar Four-Five, Lumbar Five-Sacral One, RIGHT SIDE APPROACH (Right)  Patient Location: PACU  Anesthesia Type:General  Level of Consciousness: drowsy  Airway & Oxygen Therapy: Patient Spontanous Breathing and Patient connected to face mask oxygen  Post-op Assessment: Report given to RN and Post -op Vital signs reviewed and stable  Post vital signs: Reviewed and stable  Last Vitals:  Vitals Value Taken Time  BP 129/69 09/26/21 1832  Temp 36.4 C 09/26/21 1832  Pulse 73 09/26/21 1833  Resp 19 09/26/21 1833  SpO2 100 % 09/26/21 1833  Vitals shown include unvalidated device data.  Last Pain:  Vitals:   09/26/21 0902  TempSrc:   PainSc: 5       Patients Stated Pain Goal: 4 (08/67/61 9509)  Complications: No notable events documented.

## 2021-09-26 NOTE — Progress Notes (Signed)
   Providing Compassionate, Quality Care - Together  NEUROSURGERY PROGRESS NOTE   S: pt s/e in recovery  O: EXAM:  BP (!) 154/81 (BP Location: Right Arm)   Pulse 89   Temp 97.6 F (36.4 C)   Resp 18   Ht 5\' 8"  (1.727 m)   Wt 90.7 kg   SpO2 95%   BMI 30.41 kg/m   Awake, alert,  Speech fluent PERR CNs grossly intact  5/5 BUE 4+/5 BLE  Incisions c/d/i  ASSESSMENT:  80 y.o. male with   L4-5 spondylolisthesis and L5-S1 Spondylosis with RLE foot drop  S/p L4-S1 TLIF on 09/26/2021  PLAN: - pt/ot - pain control - pulse ox - updated family    Thank you for allowing me to participate in this patient's care.  Please do not hesitate to call with questions or concerns.   [name], [facility] Cell: [phone]

## 2021-09-26 NOTE — Anesthesia Postprocedure Evaluation (Signed)
Anesthesia Post Note  Patient: Kristopher [name]  Procedure(s) Performed: Minimally Invasive Transforaminal Lumbar Interbody Fusion, Lumbar Four-Five, Lumbar Five-Sacral One, RIGHT SIDE APPROACH (Right)     Patient location during evaluation: PACU Anesthesia Type: General Level of consciousness: awake and alert, patient cooperative and oriented Pain management: pain level controlled Vital Signs Assessment: post-procedure vital signs reviewed and stable Respiratory status: spontaneous breathing, nonlabored ventilation and respiratory function stable Cardiovascular status: blood pressure returned to baseline and stable Postop Assessment: no apparent nausea or vomiting Anesthetic complications: no   No notable events documented.  Last Vitals:  Vitals:   09/26/21 1845 09/26/21 1900  BP: (!) 154/81 (!) 161/85  Pulse: 89 97  Resp: 18 10  Temp:    SpO2: 95% 95%    Last Pain:  Vitals:   09/26/21 1832  TempSrc:   PainSc: Asleep                 Charnay Nazario,E. Bradey Luzier

## 2021-09-26 NOTE — Op Note (Signed)
Providing Compassionate, Quality Care - Together    PREOP DIAGNOSIS:  L4-5 spondylolisthesis, grade 1, mobile with severe lateral recess and foraminal stenosis L5-S1 degenerative disc disease with severe lateral recess and foraminal stenosis Right lower extremity radiculopathy with weakness in tibialis anterior and EHL   POSTOP DIAGNOSIS: Same   PROCEDURE: Minimally invasive L4-5 transforaminal lumbar interbody fusion, L5-S1 transforaminal interbody fusion right-sided approach Placement of anterior biomechanical device, L4-5, L5-S1 K2M [place] 7 x 28 mm titanium interbody at L4-5; 10 x 28 mm titanium interbody at L5-S1 Minimally invasive L4-5 laminectomy, lateral recess decompression, right facetectomy for decompression of neural elements Minimally invasive L5-S1 laminectomy, lateral recess decompression, right facetectomy for decompression of neural elements L4, L5, S1 segmental pedicle screw instrumentation, K2M Everest system Intraoperative use of autograft, same incision Intraoperative use of allograft Intraoperative use of BMP Intraoperative use of fluoroscopy, greater than 1 hour Intraoperative use of microscope for microdissection   SURGEON: Dr. Pieter Partridge C. Kenon Delashmit, DO   ASSISTANT: Weston Brass, NP   ANESTHESIA: General Endotracheal   EBL: 150 cc   SPECIMENS: None   DRAINS: None   COMPLICATIONS: None   CONDITION: Hemodynamically stable   HISTORY: Kristopher [name] is a 80 y.o. male that presented in the outpatient setting with complaints of worsening low back pain and right lower extremity pain with weakness in his foot while walking.  MRI revealed  PROCEDURE IN DETAIL: The patient was brought to the operating room. After induction of general anesthesia, the patient was positioned on the operative table in the prone position. All pressure points were meticulously padded. Skin incision was then marked out and prepped and draped in the usual sterile fashion.  Physician  driven timeout was performed.   Using biplanar fluoroscopy, the C-arm for sterilely draped and brought into the field and the paramedian incisions were planned over the L4-5 and L5-S1 interspace.  Local anesthetic was injected bilaterally.  Using a 10 blade, paramedian incision was created bilaterally.  Using Jamshidi, the bilateral L4 pedicles were accessed under biplanar fluoroscopy.  K wires were placed and the Jamshidi's were removed.  This was repeated bilaterally at L5 and S1.  Attention was then turned to docking the Metrix dilator.  Using with a series of dilators, on the patient's right side the facet lamina junction was docked with a 26 mm tube.  This was confirmed to be in the appropriate trajectory under lateral fluoroscopy.  At this point the microscope was sterilely draped and brought into the field.   Using Bovie electrocautery, soft tissue was cleared from the lamina and facet at L4-5.  Using a high-speed drill and collecting the autograft, a complete facetectomy was performed down to the ligamentum flavum.  This autograft was saved for later.  The ligamentum flavum was identified to its attachment along the superior lamina and lateral pars.  Using a series of micro curettes, the ligamentum flavum was gently elevated and the epidural space was identified.  The ligamentum flavum was completely resected using Kerrison rongeurs.  The traversing and exiting nerve roots were identified.  Using Kerrison rongeurs, the common dural tube and traversing and exiting nerve roots were completely decompressed from the ligamentum flavum.  They appeared pulsatile and noncompressed.  Camden's triangle was identified, the traversing nerve root was gently retracted medially, and the epidural space was coagulated and cleared of epidural veins.  The exiting nerve root was quite compressed due to the spondylolisthesis.  This was decompressed by complete removal of the pars  with Kerrison rongeurs.  The disc base was  identified.  The disc annulus was then coagulated and incised with an 11 blade.  Using Kerrison rongeurs the annulotomy was widened.  Using a series of disc prep shavers and curettes a radical discectomy was performed to subchondral bleeding bone at both endplates.  The disc base was then trialed and found to be in 8x28 mm interbody size.  A mixture of autograft and allograft was then placed anteriorly and medially and packed with a bone tamp.  One half of the BMP was placed anterior to the interbody device.  Using a nerve root retractor, the traversing nerve root was gently protected during placement of the interbody.   Using lateral fluoroscopy, the interbody was then placed under live fluoroscopy.  Appropriate placement was identified.  The second half of BMP was placed laterally to the interbody device in the interspace.  The remainder of the autograft and allograft was then placed laterally in the intervertebral space to the interbody device.  Hemostasis was achieved with passive hemostatics and bipolar cautery.  The traversing nerve root, neural tube and exiting nerve root were followed with a ball-tipped probe and noted to be noncompressed, pulsatile and in their normal position.  A few pieces of Gelfoam with thrombin were placed over the thecal sac.  The Metrix dilator tube was gently removed and hemostasis was achieved with bipolar cautery and the soft tissues.  Attention was then turned to the L5-S1 decompression and interbody fusion. Using with a series of dilators, on the patient's right side the facet lamina junction was docked with a 26 mm tube.  This was confirmed to be in the appropriate trajectory under lateral fluoroscopy.  At this point the microscope was sterilely draped and brought into the field.   Using Bovie electrocautery, soft tissue was cleared from the lamina and facet at L5-S1.  Using a high-speed drill and collecting the autograft, a complete facetectomy was performed down to the  ligamentum flavum.  This autograft was saved for later use.  The ligamentum flavum was identified to its attachment along the superior lamina and lateral pars.  Using a series of micro curettes, the ligamentum flavum was gently elevated and the epidural space was identified.  The ligamentum flavum was completely resected using Kerrison rongeurs.  The traversing and exiting nerve roots were identified.  Using Kerrison rongeurs, the common dural tube and traversing and exiting nerve roots were completely decompressed from the ligamentum flavum.  They appeared pulsatile and noncompressed.  Camden's triangle was identified, the traversing nerve root was gently retracted medially, and the epidural space was coagulated and cleared of epidural veins.  The disc base was identified.  The disc annulus was then coagulated and incised with an 11 blade.  Using Kerrison rongeurs the annulotomy was widened.  Using a series of disc prep shavers and curettes a radical discectomy was performed to subchondral bleeding bone at both endplates.  The disc base was then trialed and found to be in 10x28 mm interbody size.  A mixture of autograft and allograft was then placed anteriorly and medially and packed with a bone tamp.  One half of the BMP was placed anterior to the interbody device.  Using a nerve root retractor, the traversing nerve root was gently protected during placement of the interbody.   Using lateral fluoroscopy, the interbody was then placed under live fluoroscopy.  Appropriate placement was identified.  The second half of BMP was placed laterally to the interbody device  in the interspace. The remainder of the autograft and allograft was then placed laterally in the intervertebral space to the interbody device.  Hemostasis was achieved with passive hemostatics and bipolar cautery.  The traversing nerve root, neural tube and exiting nerve root were followed with a ball-tipped probe and noted to be noncompressed,  pulsatile and in their normal position.  A few pieces of Gelfoam with thrombin were placed over the thecal sac.  The Metrix dilator tube was gently removed and hemostasis was achieved with bipolar cautery and the soft tissues.   The pedicle screws were then placed under lateral fluoroscopy over the K wires bilaterally at L4 and L5 and S1 (left L4 6.5 x 50 mm, right L4 6.5 x 55 mm; L5 7.5 x 55 mm bilaterally, 7.5 x 55 mm bilaterally at S1).  Appropriate sized rods were measured contoured and placed percutaneously.  These were confirmed to be within the tulips, setscrews were placed and final tightened to the manufacturer's recommendations.  Percutaneous towers were removed from the screws.  Final AP and lateral fluoroscopy images confirmed appropriate placement of all hardware.  Hemostasis was achieved with bipolar cautery.  The wound was closed in layers, 2-0 and 3-0 Vicryl sutures for the dermis.  Skin was closed with skin glue.  Sterile dressing was applied.  At the end of the case all sponge, needle, and instrument counts were correct. The patient was then transferred to the stretcher, extubated, and taken to the post-anesthesia care unit in stable hemodynamic condition.

## 2021-09-26 NOTE — Anesthesia Procedure Notes (Signed)
Procedure Name: Intubation Date/Time: 09/26/2021 1:47 PM Performed by: Griffin Dakin, CRNA Pre-anesthesia Checklist: Patient identified, Emergency Drugs available, Suction available and Patient being monitored Patient Re-evaluated:Patient Re-evaluated prior to induction Oxygen Delivery Method: Circle system utilized Preoxygenation: Pre-oxygenation with 100% oxygen Induction Type: IV induction Ventilation: Mask ventilation without difficulty and Oral airway inserted - appropriate to patient size Laryngoscope Size: 4 and Glidescope Grade View: Grade I Tube type: Oral Tube size: 7.5 mm Number of attempts: 1 Airway Equipment and Method: Stylet and Oral airway Placement Confirmation: ETT inserted through vocal cords under direct vision, positive ETCO2 and breath sounds checked- equal and bilateral Secured at: 24 cm Tube secured with: Tape Dental Injury: Teeth and Oropharynx as per pre-operative assessment

## 2021-09-27 MED ORDER — HYDROCODONE-ACETAMINOPHEN 5-325 MG PO TABS: 1.0000 | ORAL_TABLET | ORAL | 0 refills | Status: DC | PRN

## 2021-09-27 MED ORDER — METHOCARBAMOL 750 MG PO TABS: 750.0000 mg | ORAL_TABLET | Freq: Four times a day (QID) | ORAL | 0 refills | Status: DC

## 2021-09-27 NOTE — Discharge Summary (Signed)
Physician Discharge Summary  Patient ID: Kristopher [name] MRN: [id number] DOB/AGE: 82-Dec-1942 82 y.o.  Admit date: 09/26/2021 Discharge date: 09/27/2021  Admission Diagnoses: L4-5 spondylolisthesis, grade 1, mobile with severe lateral recess and foraminal stenosis, L5-S1 degenerative disc disease with severe lateral recess and foraminal stenosis, Right lower extremity radiculopathy with weakness in tibialis anterior and EHL    Discharge Diagnoses: L4-5 spondylolisthesis, grade 1, mobile with severe lateral recess and foraminal stenosis, L5-S1 degenerative disc disease with severe lateral recess and foraminal stenosis, Right lower extremity radiculopathy with weakness in tibialis anterior and EHL   Active Problems:   Spondylolisthesis, lumbar region   Discharged Condition: good  Hospital Course: The patient was admitted on 09/26/2021 and taken to the operating room where the patient underwent L4-5, L5-S1 decompression, transforaminal interbody fusion. The patient tolerated the procedure well and was taken to the recovery room and then to the floor in stable condition. The hospital course was routine. There were no complications. The wound remained clean dry and intact. Pt had appropriate back soreness. No complaints of leg pain or new N/T/W. The patient remained afebrile with stable vital signs, and tolerated a regular diet. The patient continued to increase activities, and pain was well controlled with oral pain medications.   Consults: None  Significant Diagnostic Studies: radiology: X-Ray: intraoperative   Treatments: surgery:  Minimally invasive L4-5 transforaminal lumbar interbody fusion, L5-S1 transforaminal interbody fusion right-sided approach Placement of anterior biomechanical device, L4-5, L5-S1 K2M [place] 7 x 28 mm titanium interbody at L4-5; 10 x 28 mm titanium interbody at L5-S1 Minimally invasive L4-5 laminectomy, lateral recess decompression, right facetectomy for  decompression of neural elements Minimally invasive L5-S1 laminectomy, lateral recess decompression, right facetectomy for decompression of neural elements L4, L5, S1 segmental pedicle screw instrumentation, K2M Everest system Intraoperative use of autograft, same incision Intraoperative use of allograft Intraoperative use of BMP Intraoperative use of fluoroscopy, greater than 1 hour Intraoperative use of microscope for microdissection  Discharge Exam: Blood pressure (!) 142/70, pulse 88, temperature 97.8 F (36.6 C), temperature source Oral, resp. rate 18, height 5\' 8"  (1.727 m), weight 90.7 kg, SpO2 95 %. Physical Exam: Patient is awake, A/O X 4, conversant, and in good spirits. They are in NAD and VSS. Doing well. Speech is fluent and appropriate. MAEW. Left EHL 5/5, right EHL 4/5. PERLA, EOMI. CNs grossly intact. Dressing is clean dry intact. Incision is well approximated with no drainage, erythema, or edema.    Disposition: Discharge disposition: 01-Home or Self Care       Discharge Instructions     Incentive spirometry RT   Complete by: As directed       Allergies as of 09/27/2021       Reactions   Fire Ant Other (See Comments)   Burning and itching all over   Wasp Venom Other (See Comments)   Burning and itching all over        Medication List     STOP taking these medications    HYDROmorphone 4 MG tablet Commonly known as: DILAUDID       TAKE these medications    ALPRAZolam 0.5 MG tablet Commonly known as: XANAX Take 0.5 mg by mouth at bedtime.   diphenhydramine-acetaminophen 25-500 MG Tabs tablet Commonly known as: TYLENOL PM Take 2 tablets by mouth at bedtime.   EPINEPHrine 0.3 mg/0.3 mL Soaj injection Commonly known as: EPI-PEN Inject 0.3 mg into the muscle as needed for anaphylaxis.   fluticasone 50 MCG/ACT nasal spray  Commonly known as: FLONASE Place 2 sprays into both nostrils at bedtime.   gabapentin 100 MG capsule Commonly known  as: NEURONTIN Take 100 mg by mouth 3 (three) times daily.   Gaviscon 95-358 MG/15ML Susp Generic drug: aluminum hydroxide-magnesium carbonate Take 15 mLs by mouth daily as needed for indigestion or heartburn.   HYDROcodone-acetaminophen 5-325 MG tablet Commonly known as: NORCO/VICODIN Take 1-2 tablets by mouth every 4 (four) hours as needed for moderate pain.   methocarbamol 750 MG tablet Commonly known as: Robaxin-750 Take 1 tablet (750 mg total) by mouth 4 (four) times daily.   pantoprazole 20 MG tablet Commonly known as: Protonix Take 1 tablet (20 mg total) by mouth daily before breakfast.   SOOTHE XP OP Apply 1 drop to eye daily as needed (dry eyes).   SUPER B COMPLEX PO Take 1 tablet by mouth daily.         Signed: Marvis Moeller 09/27/2021, 8:41 AM

## 2021-09-27 NOTE — Evaluation (Signed)
Physical Therapy Evaluation and Discharge Patient Details Name: Kristopher [name] MRN: [id number] DOB: [birth year] Today's Date: 09/27/2021  History of Present Illness  Pt is 80 y/o M admitted 09/26/21 with isthmic spondylolisthesis with severe stenosis in the lateral recess and foramina bilaterally at L4-5 with severe stenosis in the lateral recess and foramina at L5-S1 with severe degenerative disc disease. Pt is s/p L4-S1 TLIF on 09/26/2021. PMH includes biceps tendon rupture, OA, lateral epicondylitis, and pre-diabetes.  Clinical Impression  Patient evaluated by Physical Therapy with no further acute PT needs identified. Pt reports mild radicular pain in R hip and groin, but reports this has improved post op in addition to RLE strength. Pt overall moving fairly well, ambulating hallway distances with a walker and negotiated 4 steps with a railing to simulate home set up. Education reviewed regarding spinal precautions, activity and DME recommendations. All education has been completed and the patient has no further questions. No follow-up Physical Therapy or equipment needs. PT is signing off. Thank you for this referral.      Recommendations for follow up therapy are one component of a multi-disciplinary discharge planning process, led by the attending physician.  Recommendations may be updated based on patient status, additional functional criteria and insurance authorization.  Follow Up Recommendations No PT follow up    Equipment Recommendations  None recommended by PT    Recommendations for Other Services       Precautions / Restrictions Precautions Precautions: Back Precaution Booklet Issued: Yes (comment) Restrictions Weight Bearing Restrictions: No      Mobility  Bed Mobility Overal bed mobility: Modified Independent Bed Mobility: Sidelying to Sit;Rolling;Sit to Sidelying Rolling: Min assist Sidelying to sit: Min assist     Sit to sidelying: Min assist General bed  mobility comments: Cues for log roll technique, use of bed rail    Transfers Overall transfer level: Modified independent Equipment used: Rolling walker (2 wheeled) Transfers: Sit to/from Stand Sit to Stand: Min guard            Ambulation/Gait Ambulation/Gait assistance: Modified independent (Device/Increase time) Gait Distance (Feet): 400 Feet Assistive device: Rolling walker (2 wheeled) Gait Pattern/deviations: Step-through pattern;Decreased stride length Gait velocity: decreased   General Gait Details: Mildly increased trunk flexion, slow and steady pace, no gross imbalance noted  Stairs Stairs: Yes Stairs assistance: Modified independent (Device/Increase time) Stair Management: One rail Left Number of Stairs: 4 General stair comments: cues for step by step pattern  Wheelchair Mobility    Modified Rankin (Stroke Patients Only)       Balance Overall balance assessment: Needs assistance Sitting-balance support: No upper extremity supported;Feet supported Sitting balance-Leahy Scale: Good     Standing balance support: During functional activity;Bilateral upper extremity supported;No upper extremity supported Standing balance-Leahy Scale: Fair Standing balance comment: Able to let go of walker with both hands to pull pants over hips in standing position.                             Pertinent Vitals/Pain Pain Assessment: Faces Faces Pain Scale: Hurts even more Pain Location: back Pain Descriptors / Indicators: Aching;Grimacing;Guarding;Sore Pain Intervention(s): Monitored during session    Home Living Family/patient expects to be discharged to:: Private residence Living Arrangements: Spouse/significant other Available Help at Discharge: Family;Available 24 hours/day Type of Home: House Home Access: Stairs to enter Entrance Stairs-Rails: Left Entrance Stairs-Number of Steps: 4 Home Layout: One level Home Equipment: Walker - 2 wheels  Prior Function Level of Independence: Needs assistance   Gait / Transfers Assistance Needed: Pt reports that he required assistance with functional transfers and bed mobility.  ADL's / Homemaking Assistance Needed: Pt reports that he required assistance with LB dressing and bathing.        Hand Dominance        Extremity/Trunk Assessment   Upper Extremity Assessment Upper Extremity Assessment: Overall WFL for tasks assessed    Lower Extremity Assessment Lower Extremity Assessment: RLE deficits/detail;LLE deficits/detail RLE Deficits / Details: Strength 5/5 LLE Deficits / Details: Strength 5/5    Cervical / Trunk Assessment Cervical / Trunk Assessment: Other exceptions Cervical / Trunk Exceptions: s/p back surgery  Communication   Communication: No difficulties  Cognition Arousal/Alertness: Awake/alert Behavior During Therapy: WFL for tasks assessed/performed Overall Cognitive Status: Within Functional Limits for tasks assessed                                        General Comments      Exercises     Assessment/Plan    PT Assessment Patent does not need any further PT services  PT Problem List         PT Treatment Interventions      PT Goals (Current goals can be found in the Care Plan section)  Acute Rehab PT Goals Patient Stated Goal: return home PT Goal Formulation: All assessment and education complete, DC therapy    Frequency     Barriers to discharge        Co-evaluation               AM-PAC PT "6 Clicks" Mobility  Outcome Measure Help needed turning from your back to your side while in a flat bed without using bedrails?: None Help needed moving from lying on your back to sitting on the side of a flat bed without using bedrails?: None Help needed moving to and from a bed to a chair (including a wheelchair)?: None Help needed standing up from a chair using your arms (e.g., wheelchair or bedside chair)?: None Help  needed to walk in hospital room?: None Help needed climbing 3-5 steps with a railing? : None 6 Click Score: 24    End of Session   Activity Tolerance: Patient tolerated treatment well Patient left: in bed;with call bell/phone within reach Nurse Communication: Mobility status PT Visit Diagnosis: Difficulty in walking, not elsewhere classified (R26.2);Pain Pain - part of body:  (back)    Time: 3646-8032 PT Time Calculation (min) (ACUTE ONLY): 18 min   Charges:   PT Evaluation $PT Eval Low Complexity: [place], PT, DPT Acute Rehabilitation Services Pager [phone] Office [phone]   Deno Etienne 09/27/2021, 11:07 AM

## 2021-09-27 NOTE — Evaluation (Signed)
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 Pt is s/p L4-S1 TLIF on 09/26/2021. PMH includes biceps tendon rupture, OA, lateral epicondylitis, and pre-diabetes.   Clinical Impression   Pt presents with decreased balance, activity tolerance, and pain. Pt currently requiring supervision - Min A with most ADLs, Mod A with LB dressing, and supervision - Min guard for functional transfers/mobility. Pt lives with spouse who will be available to provide assistance as needed at home. Pt educated on back precautions, AE use, and compensatory strategies for ADLs. Pt should be safe to return home without any further skilled OT services once medically cleared. No further skilled OT needs at this time. Will sign off.     Recommendations for follow up therapy are one component of a multi-disciplinary discharge planning process, led by the attending physician.  Recommendations may be updated based on patient status, additional functional criteria and insurance authorization.   Follow Up Recommendations  No OT follow up    Equipment Recommendations  3 in 1 bedside commode    Recommendations for Other Services       Precautions / Restrictions Precautions Precautions: Back Precaution Booklet Issued: Yes (comment) Restrictions Weight Bearing Restrictions: No      Mobility Bed Mobility Overal bed mobility: Needs Assistance Bed Mobility: Sidelying to Sit;Rolling;Sit to Sidelying Rolling: Min assist Sidelying to sit: Min assist     Sit to sidelying: Min assist General bed mobility comments: Pt required Min A for log roll technique, to raise trunk  from bed, and to assist legs back into bed.    Transfers Overall transfer level: Needs assistance Equipment used: Rolling walker (2 wheeled) Transfers: Sit to/from Stand Sit to Stand: Min guard              Balance Overall balance assessment: Needs assistance Sitting-balance support: No upper extremity supported;Feet supported Sitting balance-Leahy Scale: Good     Standing balance support: During functional activity;Bilateral upper extremity supported;No upper extremity supported Standing balance-Leahy Scale: Fair Standing balance comment: Able to let go of walker with both hands to pull pants over hips in standing position.                           ADL either performed or assessed with clinical judgement   ADL Overall ADL's : Needs assistance/impaired Eating/Feeding: Independent;Sitting   Grooming: Independent;Standing   Upper Body Bathing: Supervision/ safety;Sitting   Lower Body Bathing: Minimal assistance;Sitting/lateral leans;Sit to/from stand;Adhering to back precautions   Upper Body Dressing : Independent;Sitting   Lower Body Dressing: Moderate assistance;Sitting/lateral leans;Sit to/from stand;Adhering to back precautions   Toilet Transfer: Min guard;Ambulation;Regular Toilet;Grab bars;RW   Toileting- Clothing Manipulation and Hygiene: Independent;Sitting/lateral lean   Tub/ Banker: Min guard;Ambulation;3 in 1;Rolling walker   Functional mobility during ADLs: Supervision/safety;Rolling walker General ADL Comments: Pt limited by pain, balance, and back precautions. Pt reports that he was requiring assistance for a couple of months leading up to surgery due to pain. Anticipate that pt will have improved independence after healing and restrictions are lifted.     Vision   Vision Assessment?: No apparent visual deficits     Perception  Praxis      Pertinent Vitals/Pain Pain Assessment: Faces Faces Pain Scale: Hurts even  more Pain Location: back Pain Descriptors / Indicators: Aching;Grimacing;Guarding;Sore Pain Intervention(s): Monitored during session;Repositioned     Hand Dominance     Extremity/Trunk Assessment Upper Extremity Assessment Upper Extremity Assessment: Overall WFL for tasks assessed   Lower Extremity Assessment Lower Extremity Assessment: Defer to PT evaluation   Cervical / Trunk Assessment Cervical / Trunk Assessment: Other exceptions Cervical / Trunk Exceptions: s/p back surgery   Communication Communication Communication: No difficulties   Cognition Arousal/Alertness: Awake/alert Behavior During Therapy: WFL for tasks assessed/performed Overall Cognitive Status: Within Functional Limits for tasks assessed                                     General Comments       Exercises     Shoulder Instructions      Home Living Family/patient expects to be discharged to:: Private residence Living Arrangements: Spouse/significant other Available Help at Discharge: Family;Available 24 hours/day Type of Home: House Home Access: Stairs to enter [employer] of Steps: 4 Entrance Stairs-Rails: Left Home Layout: One level     Bathroom Shower/Tub: Occupational psychologist: Standard     Home Equipment: Environmental consultant - 2 wheels          Prior Functioning/Environment Level of Independence: Needs assistance  Gait / Transfers Assistance Needed: Pt reports that he required assistance with functional transfers and bed mobility. ADL's / Homemaking Assistance Needed: Pt reports that he required assistance with LB dressing and bathing.            OT Problem List: Decreased activity tolerance;Impaired balance (sitting and/or standing);Decreased safety awareness;Decreased knowledge of use of DME or AE;Decreased knowledge of precautions;Pain      OT Treatment/Interventions:      OT Goals([name] goals can be found in the care plan section) Acute Rehab  OT Goals Patient Stated Goal: return home OT Goal Formulation: With patient  OT Frequency:     Barriers to D/C:            Co-evaluation              AM-PAC OT "6 Clicks" Daily Activity     Outcome Measure Help from another person eating meals?: None Help from another person taking care of personal grooming?: None Help from another person toileting, which includes using toliet, bedpan, or urinal?: A Little Help from another person bathing (including washing, rinsing, drying)?: A Little Help from another person to put on and taking off regular upper body clothing?: None Help from another person to put on and taking off regular lower body clothing?: A Lot 6 Click Score: 20   End of Session Equipment Utilized During Treatment: Rolling walker Nurse Communication: Mobility status  Activity Tolerance: Patient tolerated treatment well Patient left: in bed;with call bell/phone within reach  OT Visit Diagnosis: Unsteadiness on feet (R26.81);Other abnormalities of gait and mobility (R26.89);Pain                Time: 6720-9470 OT Time Calculation (min): 27 min Charges:  OT General Charges $OT Visit: 1 Visit OT Evaluation $OT Eval Low Complexity: 1 Low OT Treatments $Self Care/Home Management : 8-22 mins  Lillyn Wieczorek C, OT/L  Acute Rehab [place] 09/27/2021, 9:10 AM

## 2021-09-27 NOTE — Plan of Care (Signed)
Pt doing well. Pt and wife given D/C instructions with verbal understanding. Rx's were sent to the pharmacy by MD. Pt's incision is clean and dry with no sign of infection. Pt's IV was removed prior to D/C. Pt D/C'd home via wheelchair per MD order. Pt is stable @ D/C and has no other needs at this time. Pt received 3-n-1 from [employer] prior to D/C. Holli Humbles, RN

## 2021-09-28 ENCOUNTER — Encounter (HOSPITAL_COMMUNITY): Payer: Self-pay | Admitting: Neurological Surgery

## 2021-09-29 ENCOUNTER — Emergency Department (HOSPITAL_COMMUNITY): Payer: Medicare HMO

## 2021-09-29 ENCOUNTER — Encounter (HOSPITAL_COMMUNITY): Payer: Self-pay | Admitting: *Deleted

## 2021-09-29 ENCOUNTER — Other Ambulatory Visit: Payer: Self-pay

## 2021-09-29 ENCOUNTER — Emergency Department (HOSPITAL_COMMUNITY)
Admission: EM | Admit: 2021-09-29 | Discharge: 2021-09-29 | Disposition: A | Discharge status: Home / Self Care | Payer: Medicare HMO | Attending: Emergency Medicine | Admitting: Emergency Medicine

## 2021-09-29 DIAGNOSIS — Z20822 Contact with and (suspected) exposure to covid-19: Secondary | ICD-10-CM | POA: Diagnosis not present

## 2021-09-29 DIAGNOSIS — G8918 Other acute postprocedural pain: Secondary | ICD-10-CM

## 2021-09-29 DIAGNOSIS — Z9889 Other specified postprocedural states: Secondary | ICD-10-CM | POA: Insufficient documentation

## 2021-09-29 DIAGNOSIS — R509 Fever, unspecified: Secondary | ICD-10-CM | POA: Insufficient documentation

## 2021-09-29 LAB — CBC WITH DIFFERENTIAL/PLATELET
Abs Immature Granulocytes: 0.14 10*3/uL — ABNORMAL HIGH (ref 0.00–0.07)
Basophils Absolute: 0 10*3/uL (ref 0.0–0.1)
Basophils Relative: 0 %
Eosinophils Absolute: 0 10*3/uL (ref 0.0–0.5)
Eosinophils Relative: 0 %
HCT: 38.7 % — ABNORMAL LOW (ref 39.0–52.0)
Hemoglobin: 13.2 g/dL (ref 13.0–17.0)
Immature Granulocytes: 1 %
Lymphocytes Relative: 15 %
Lymphs Abs: 2.6 10*3/uL (ref 0.7–4.0)
MCH: 34.2 pg — ABNORMAL HIGH (ref 26.0–34.0)
MCHC: 34.1 g/dL (ref 30.0–36.0)
MCV: 100.3 fL — ABNORMAL HIGH (ref 80.0–100.0)
Monocytes Absolute: 2.1 10*3/uL — ABNORMAL HIGH (ref 0.1–1.0)
Monocytes Relative: 12 %
Neutro Abs: 12 10*3/uL — ABNORMAL HIGH (ref 1.7–7.7)
Neutrophils Relative %: 72 %
Platelets: 273 10*3/uL (ref 150–400)
RBC: 3.86 MIL/uL — ABNORMAL LOW (ref 4.22–5.81)
RDW: 13.8 % (ref 11.5–15.5)
WBC: 16.9 10*3/uL — ABNORMAL HIGH (ref 4.0–10.5)
nRBC: 0 % (ref 0.0–0.2)

## 2021-09-29 LAB — RESP PANEL BY RT-PCR (FLU A&B, COVID) ARPGX2
Influenza A by PCR: NEGATIVE
Influenza B by PCR: NEGATIVE
SARS Coronavirus 2 by RT PCR: NEGATIVE

## 2021-09-29 LAB — COMPREHENSIVE METABOLIC PANEL
ALT: 18 U/L (ref 0–44)
AST: 36 U/L (ref 15–41)
Albumin: 3.5 g/dL (ref 3.5–5.0)
Alkaline Phosphatase: 62 U/L (ref 38–126)
Anion gap: 11 (ref 5–15)
BUN: 17 mg/dL (ref 8–23)
CO2: 22 mmol/L (ref 22–32)
Calcium: 8.7 mg/dL — ABNORMAL LOW (ref 8.9–10.3)
Chloride: 101 mmol/L (ref 98–111)
Creatinine, Ser: 1.43 mg/dL — ABNORMAL HIGH (ref 0.61–1.24)
GFR, Estimated: 49 mL/min — ABNORMAL LOW (ref >60)
Glucose, Bld: 186 mg/dL — ABNORMAL HIGH (ref 70–99)
Potassium: 4.6 mmol/L (ref 3.5–5.1)
Sodium: 134 mmol/L — ABNORMAL LOW (ref 135–145)
Total Bilirubin: 1.2 mg/dL (ref 0.3–1.2)
Total Protein: 6.8 g/dL (ref 6.5–8.1)

## 2021-09-29 LAB — URINALYSIS, ROUTINE W REFLEX MICROSCOPIC
Bilirubin Urine: NEGATIVE
Glucose, UA: NEGATIVE mg/dL
Hgb urine dipstick: NEGATIVE
Ketones, ur: NEGATIVE mg/dL
Leukocytes,Ua: NEGATIVE
Nitrite: NEGATIVE
Protein, ur: NEGATIVE mg/dL
Specific Gravity, Urine: >1.046 — ABNORMAL HIGH (ref 1.005–1.030)
pH: 6 (ref 5.0–8.0)

## 2021-09-29 LAB — PROTIME-INR
INR: 1.3 — ABNORMAL HIGH (ref 0.8–1.2)
Prothrombin Time: 15.7 seconds — ABNORMAL HIGH (ref 11.4–15.2)

## 2021-09-29 LAB — LACTIC ACID, PLASMA
Lactic Acid, Venous: 2 mmol/L (ref 0.5–1.9)
Lactic Acid, Venous: 1 mmol/L (ref 0.5–1.9)

## 2021-09-29 MED ORDER — SODIUM CHLORIDE 0.9 % IV BOLUS
1000.0000 mL | Freq: Once | INTRAVENOUS | Status: AC
  Administered 2021-09-29: 1000 mL via INTRAVENOUS

## 2021-09-29 MED ORDER — HYDROMORPHONE HCL 1 MG/ML IJ SOLN
1.0000 mg | Freq: Once | INTRAMUSCULAR | Status: AC
  Administered 2021-09-29: 1 mg via INTRAVENOUS
  Filled 2021-09-29: qty 1

## 2021-09-29 MED ORDER — OXYCODONE HCL 5 MG PO TABS: 5.0000 mg | ORAL_TABLET | Freq: Four times a day (QID) | ORAL | 0 refills | Status: DC | PRN

## 2021-09-29 MED ORDER — HYDROMORPHONE HCL 2 MG PO TABS
4.0000 mg | ORAL_TABLET | Freq: Once | ORAL | Status: AC
  Administered 2021-09-29: 4 mg via ORAL
  Filled 2021-09-29: qty 2

## 2021-09-29 MED ORDER — SODIUM CHLORIDE 0.9 % IV BOLUS
500.0000 mL | Freq: Once | INTRAVENOUS | Status: AC
  Administered 2021-09-29: 500 mL via INTRAVENOUS

## 2021-09-29 MED ORDER — METHYLPREDNISOLONE 4 MG PO TBPK: ORAL_TABLET | ORAL | 0 refills | Status: DC

## 2021-09-29 MED ORDER — IOHEXOL 300 MG/ML  SOLN
75.0000 mL | Freq: Once | INTRAMUSCULAR | Status: AC | PRN
  Administered 2021-09-29: 75 mL via INTRAVENOUS

## 2021-09-29 MED ORDER — ONDANSETRON HCL 4 MG/2ML IJ SOLN
4.0000 mg | Freq: Once | INTRAMUSCULAR | Status: AC
  Administered 2021-09-29: 4 mg via INTRAVENOUS
  Filled 2021-09-29: qty 2

## 2021-09-29 MED ORDER — DIAZEPAM 5 MG/ML IJ SOLN
2.5000 mg | Freq: Once | INTRAMUSCULAR | Status: AC
  Administered 2021-09-29: 2.5 mg via INTRAVENOUS
  Filled 2021-09-29: qty 2

## 2021-09-29 MED ORDER — MORPHINE SULFATE (PF) 4 MG/ML IV SOLN: 4.0000 mg | Freq: Once | INTRAVENOUS | Status: DC

## 2021-09-29 MED ORDER — DEXAMETHASONE SODIUM PHOSPHATE 4 MG/ML IJ SOLN
4.0000 mg | Freq: Once | INTRAMUSCULAR | Status: AC
  Administered 2021-09-29: 4 mg via INTRAVENOUS
  Filled 2021-09-29: qty 1

## 2021-09-29 MED ORDER — FENTANYL CITRATE PF 50 MCG/ML IJ SOSY: 50.0000 ug | PREFILLED_SYRINGE | Freq: Once | INTRAMUSCULAR | Status: DC

## 2021-09-29 MED ORDER — FENTANYL CITRATE PF 50 MCG/ML IJ SOSY
50.0000 ug | PREFILLED_SYRINGE | Freq: Once | INTRAMUSCULAR | Status: AC
  Administered 2021-09-29: 50 ug via INTRAVENOUS
  Filled 2021-09-29: qty 1

## 2021-09-29 MED ORDER — GABAPENTIN 300 MG PO CAPS: 300.0000 mg | ORAL_CAPSULE | Freq: Three times a day (TID) | ORAL | 0 refills | Status: AC

## 2021-09-29 NOTE — ED Triage Notes (Signed)
Pt had back surgery on 10/18. Reports increased pain to R side of back, uncontrolled with pain medication at home. Started having a fever last night, took tylenol pta.

## 2021-09-29 NOTE — ED Provider Notes (Signed)
Patient signed out to me at 7 AM.  Had recent lumbar fusion.  Having ongoing pain despite pain medication at home.  May be had fever at home as well.  No fever here.  No source for infection as urine is negative for infection.  COVID and flu test negative.  Chest x-ray without any obvious pneumonia.  Does have a white count of 17.  Patient was discussed with neurosurgery who recommend to get an MRI of his low back.  Patient has been given IV pain medicine.  We will follow-up MRI and neurosurgery recommendations.  Patient may be with small subdural fluid collection throughout the lumbar canal.  MRI images were reviewed by neurosurgery and overall no acute surgical process.  Plan is to give patient dose of his pain medicine via IV as well as IV Decadron.  They would like to add Medrol Dosepak, gabapentin to his home Robaxin and Dilaudid.  He is feeling more comfortable following his IV Dilaudid.  There is a question of may be some urinary retention as he has been having some frequency.  We will try to get a good postvoid bladder volume.  Family feels comfortable with the Foley catheter at home if needed.  Home health has been ordered.  Neurosurgery came down to the ED to evaluate the patient and overall if pain is managed well suspect that we will be able to discharge him to home.  Awaiting for patient to void and check a postvoid residual.  Feeling better about 30 minutes after getting this IV Dilaudid and IV Decadron.  Plan is to reevaluate him in about an hour and after he urinates.  Patient was able to fully empty his bladder.  Feeling much better after IV steroids and IV Dilaudid.  Neurosurgery came back to evaluate the patient and they recommend adding oxycodone for breakthrough pain to go along with his Dilaudid, Robaxin, gabapentin, Medrol Dosepak.  Discharged in good condition.  This chart was dictated using voice recognition software.  Despite best efforts to proofread,  errors can occur which can  change the documentation meaning.    Lennice Sites, DO 09/29/21 1405

## 2021-09-29 NOTE — ED Notes (Signed)
Patient transported to Xray and MRI

## 2021-09-29 NOTE — ED Notes (Signed)
Pt oxygen was 86% on RA, placed on 2L

## 2021-09-29 NOTE — ED Notes (Signed)
Remains in x-ray  

## 2021-09-29 NOTE — ED Notes (Signed)
Patient still in xray  

## 2021-09-29 NOTE — ED Notes (Signed)
Pt states he took tylenol pta and his temperature was 101 at home

## 2021-09-29 NOTE — Discharge Instructions (Signed)
Follow-up with neurosurgery.  Take your medications as prescribed.  Your next dose of home oral Dilaudid should be around 6 PM.  Take your first methylprednisolone dose when you get home after you pick it up from pharmacy, start gabapentin when you get home as well.  Continue your Robaxin as needed as well.  You have been written for additional narcotic pain medicine called oxycodone only if you are having breakthrough pain after you have taken all your other pain medications.  Please be careful when taking your medications as they are sedating and do not take these medications if you do not need to.

## 2021-09-29 NOTE — ED Notes (Signed)
Repositioned on right side , family at bedside.

## 2021-09-29 NOTE — Progress Notes (Signed)
Pt brought down to MRI for exam via pt transport. Pt verbally safety screened and prepared for exam. Upon completing pre-contrast imaging, pt stated they could not continue and had to get off imaging table to severe back pain. Pt advised of 7 minutes of post-contrast imaging remaining, pt again refused contrast and stated they could no longer lay on imaging table due to severe back pain. Able to obtain complete pre-contrast study. Order changed to WO to reflect this. Pt removed from scanner, transferred back to stretcher, and sent back to ED via pt transport.

## 2021-09-29 NOTE — ED Provider Notes (Signed)
Emergency Department Provider Note   I have reviewed the triage vital signs and the nursing notes.   HISTORY  Chief Complaint Fever and Post-op Problem   HPI Kristopher [name] is a 80 y.o. male with past medical history reviewed below including status post L4-5 and L5-S1 decompression and fusion with Dr. Reatha Armour on 10/19 presents emergency department with worsening back and right hip pain with fever at home.  Symptoms worsened significantly in the past 24 hours.  He states immediately postoperatively he was feeling well and could go home with his prescribed pain medication.  He been taking this medicine but symptoms have been worsening.  His wife reports T-max of 101.4 F at home.  Patient has had some mild dysuria along with cough but is overall feeling weak and having severe back pain.  Denies noticing any redness or drainage around the incision site.  No anterior abdominal or chest pain.  No cough.   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   Allergic reaction to insect sting 06/21/2017   Toxic effect of venom of ants, unintentional 05/27/2017   GERD without esophagitis 03/27/2017   Allergy to insect bites 03/26/2017   Other allergic rhinitis 03/26/2017   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   Prediabetes 11/26/2016   Diverticulosis of large intestine 02/15/2016   History of adenomatous  polyp of colon 11/21/2015    Past Surgical History:  Procedure Laterality Date   COLONOSCOPY     x 2   COLONOSCOPY WITH PROPOFOL N/A 03/05/2017   Procedure: COLONOSCOPY WITH PROPOFOL;  Surgeon: Garlan Fair, MD;  Location: [facility];  Service: Endoscopy;  Laterality: N/A;   EYE SURGERY     LUMBAR LAMINECTOMY  29   TONSILLECTOMY  age 56   TRANSFORAMINAL LUMBAR INTERBODY FUSION W/ MIS 2 LEVEL Right 09/26/2021   Procedure: Minimally Invasive Transforaminal Lumbar Interbody Fusion, Lumbar Four-Five, Lumbar Five-Sacral One, RIGHT SIDE APPROACH;  Surgeon: Karsten Ro, DO;  Location: [facility];  Service: Neurosurgery;  Laterality: Right;    Allergies Fire Set designer venom  Family History  Problem Relation Age of Onset   COPD Father    Congestive Heart Failure Father    CAD Father    Dementia Father    Pancreatic cancer Mother    GER disease Mother    Anxiety disorder Mother    Glaucoma Maternal Aunt    Colon cancer Maternal Uncle    Asthma Neg Hx    Eczema Neg Hx    Urticaria Neg Hx    Immunodeficiency Neg Hx    Angioedema Neg Hx    Allergic rhinitis Neg Hx     Social History Social History   Tobacco Use   Smoking status: Never   Smokeless tobacco: Never  Vaping Use  Vaping Use: Never used  Substance Use Topics   Alcohol use: No   Drug use: No    Review of Systems  Constitutional: Positive fever/chills and weakness.  Eyes: No visual changes. ENT: No sore throat. Cardiovascular: Denies chest pain. Respiratory: Denies shortness of breath.  Gastrointestinal: No abdominal pain.  No nausea, no vomiting.  No diarrhea.  No constipation. Genitourinary: Positive mild dysuria and frequency.  Musculoskeletal: Positive for back pain. Skin: Negative for rash. Neurological: Negative for headaches, focal weakness or numbness.  10-point ROS otherwise negative.  ____________________________________________   PHYSICAL EXAM:  VITAL SIGNS: ED Triage Vitals  Enc  Vitals Group     BP 09/29/21 0356 (!) 134/113     Pulse Rate 09/29/21 0356 (!) 101     Resp 09/29/21 0356 20     Temp 09/29/21 0356 97.7 F (36.5 C)     Temp Source 09/29/21 0356 Oral     SpO2 09/29/21 0356 94 %   Constitutional: Alert and oriented. Appears uncomfortable with any movement in bed.  Eyes: Conjunctivae are normal.  Head: Atraumatic. Nose: No congestion/rhinnorhea. Mouth/Throat: Mucous membranes are moist.   Neck: No stridor.   Cardiovascular: Normal rate, regular rhythm. Good peripheral circulation. Grossly normal heart sounds.   Respiratory: Normal respiratory effort.  No retractions. Lungs CTAB. Gastrointestinal: Soft and nontender. No distention.  Musculoskeletal: No lower extremity tenderness nor edema. No gross deformities of extremities. Neurologic:  Normal speech and language. No gross focal neurologic deficits are appreciated.  Skin:  Skin is warm, dry and intact. No rash.  Lumbar spine incisions evaluated with clean dressing.  No surrounding cellulitis.    ____________________________________________   LABS (all labs ordered are listed, but only abnormal results are displayed)  Labs Reviewed  COMPREHENSIVE METABOLIC PANEL - Abnormal; Notable for the following components:      Result Value   Sodium 134 (*)    Glucose, Bld 186 (*)    Creatinine, Ser 1.43 (*)    Calcium 8.7 (*)    GFR, Estimated 49 (*)    All other components within normal limits  LACTIC ACID, PLASMA - Abnormal; Notable for the following components:   Lactic Acid, Venous 2.0 (*)    All other components within normal limits  CBC WITH DIFFERENTIAL/PLATELET - Abnormal; Notable for the following components:   WBC 16.9 (*)    RBC 3.86 (*)    HCT 38.7 (*)    MCV 100.3 (*)    MCH 34.2 (*)    Neutro Abs 12.0 (*)    Monocytes Absolute 2.1 (*)    Abs Immature Granulocytes 0.14 (*)    All other components within normal limits  PROTIME-INR - Abnormal; Notable for the following components:    Prothrombin Time 15.7 (*)    INR 1.3 (*)    All other components within normal limits  URINALYSIS, ROUTINE W REFLEX MICROSCOPIC - Abnormal; Notable for the following components:   Specific Gravity, Urine >1.046 (*)    All other components within normal limits  RESP PANEL BY RT-PCR (FLU A&B, COVID) ARPGX2  CULTURE, BLOOD (ROUTINE X 2)  CULTURE, BLOOD (ROUTINE X 2)  URINE CULTURE  LACTIC ACID, PLASMA   ____________________________________________  RADIOLOGY  CT ABDOMEN PELVIS W CONTRAST  Result Date: 09/29/2021 CLINICAL DATA:  Abdominal pain and fever. Postop lumbar surgery. RIGHT flank pain. EXAM: CT ABDOMEN AND PELVIS WITH CONTRAST TECHNIQUE: Multidetector CT imaging of the abdomen and pelvis was performed using the standard protocol following bolus administration of  intravenous contrast. CONTRAST:  54mL OMNIPAQUE IOHEXOL 300 MG/ML  SOLN COMPARISON:  None. FINDINGS: Lower chest: Minimal basilar atelectasis.  No effusion. Hepatobiliary: No focal hepatic lesion. No biliary duct dilatation. Common bile duct is normal. Pancreas: Pancreas is normal. No ductal dilatation. No pancreatic inflammation. Spleen: Normal spleen Adrenals/urinary tract: Adrenal glands and kidneys are normal. The ureters and bladder normal. Stomach/Bowel: Stomach, small bowel, appendix, and cecum are normal. Multiple diverticula of the descending colon and sigmoid colon without acute inflammation. Vascular/Lymphatic: Abdominal aorta is normal caliber with atherosclerotic calcification. There is no retroperitoneal or periportal lymphadenopathy. No pelvic lymphadenopathy. Reproductive: Unremarkable Other: No free fluid or abscess in the abdomen pelvis Musculoskeletal: Post L4-S1 lumbar fusion. Mild inflammation in subcutaneous fat along the pedicle screw tracks. No organized fluid collections. Tiny focus of gas in the subcutaneous fat on image 46/4 in the most superior RIGHT pedicle screw tract. IMPRESSION: 1. No clear complication  following posterior lumbar fusion. Mild inflammation the subcutaneous fat related to the pedicle screw tracks. Tiny amount gas in the most superior track on the RIGHT. 2. No obstructive uropathy. 3. Normal appendix. Electronically Signed   By: Suzy Bouchard M.D.   On: 09/29/2021 06:01    ____________________________________________   PROCEDURES  Procedure(s) performed:   Procedures  None  ____________________________________________   INITIAL IMPRESSION / ASSESSMENT AND PLAN / ED COURSE  Pertinent labs & imaging results that were available during my care of the patient were reviewed by me and considered in my medical decision making (see chart for details).   Patient presents emergency department worsening back pain and fever at home.  He is currently postop day 3.  No focal neurologic deficits.  He does have some subjective numbness in the right leg but states this was present prior to surgery and not particularly worse.  Blood pressure on arrival was slightly soft.  Patient has been taking pain medications at home frequently which may be contributing.  Considering alternate diagnosis for low blood pressure including developing sepsis from infection in the postoperative setting.  Postop day 3 seems early for wound/surgical site infection but will obtain CT imaging through the abdomen and pelvis to further assess the operative site as well as alternative etiology for the patient's right side pain.  UTI is a consideration with some mild dysuria noted.  Will obtain chest x-ray.  COVID and flu are negative.   Spoke with Dr. Reatha Armour regarding the case and CT findings. Incisions look well. Plan to follow CXR and UA. If no clear fever source found and pain persists can perform MRI of the lumbar spine for better evaluation.   Care transferred to Dr. Ronnald Nian.  ____________________________________________  FINAL CLINICAL IMPRESSION(S) / ED DIAGNOSES  Final diagnoses:  Post-op pain      MEDICATIONS GIVEN DURING THIS VISIT:  Medications  ondansetron (ZOFRAN) injection 4 mg (4 mg Intravenous Given 09/29/21 0455)  sodium chloride 0.9 % bolus 1,000 mL (0 mLs Intravenous Stopped 09/29/21 0805)  fentaNYL (SUBLIMAZE) injection 50 mcg (50 mcg Intravenous Given 09/29/21 0455)  iohexol (OMNIPAQUE) 300 MG/ML solution 75 mL (75 mLs Intravenous Contrast Given 09/29/21 0534)  diazepam (VALIUM) injection 2.5 mg (2.5 mg Intravenous Given 09/29/21 0952)  sodium chloride 0.9 % bolus 500 mL (0 mLs Intravenous Stopped 09/29/21 1101)  HYDROmorphone (DILAUDID) injection 1 mg (1 mg Intravenous Given 09/29/21 1059)  dexamethasone (DECADRON) injection 4 mg (4 mg Intravenous Given 09/29/21 1055)  HYDROmorphone (DILAUDID) tablet 4 mg (4 mg Oral Given 09/29/21 1355)  NEW OUTPATIENT MEDICATIONS STARTED DURING THIS VISIT:  Discharge Medication List as of 09/29/2021  2:08 PM     START taking these medications   Details  methylPREDNISolone (MEDROL DOSEPAK) 4 MG TBPK tablet Follow package insert, Normal    oxyCODONE (OXY IR/ROXICODONE) 5 MG immediate release tablet Take 1 tablet (5 mg total) by mouth every 6 (six) hours as needed for up to 20 doses for breakthrough pain., Starting Fri 09/29/2021, Normal        Note:  This document was prepared using Dragon voice recognition software and may include unintentional dictation errors.  Nanda Quinton, MD, [facility]    Krissa Utke, [facility], MD 09/30/21 [phone]

## 2021-10-01 LAB — URINE CULTURE: Culture: 40000 — AB

## 2021-10-02 ENCOUNTER — Telehealth: Payer: Self-pay | Admitting: *Deleted

## 2021-10-02 NOTE — Telephone Encounter (Signed)
Post ED Visit - Positive Culture Follow-up  Culture report reviewed by antimicrobial stewardship pharmacist: [place] Team []  Elenor Quinones, Pharm.D. []  Heide Guile, Pharm.D., BCPS AQ-ID []  Parks Neptune, Pharm.D., BCPS []  Alycia Rossetti, Pharm.D., BCPS []  [place], Pharm.D., BCPS, AAHIVP []  Legrand Como, Pharm.D., BCPS, AAHIVP []  Salome Arnt, PharmD, BCPS []  Johnnette Gourd, PharmD, BCPS []  Hughes Better, PharmD, BCPS []  Leeroy Cha, PharmD []  Laqueta Linden, PharmD, BCPS []  Albertina Parr, PharmD  [place] Team []  Leodis Sias, PharmD []  Lindell Spar, PharmD []  Royetta Asal, PharmD []  Graylin Shiver, Rph []  Rema Fendt) Glennon Mac, PharmD []  Arlyn Dunning, PharmD []  Netta Cedars, PharmD []  Dia Sitter, PharmD []  Leone Haven, PharmD []  Gretta Arab, PharmD []  Theodis Shove, PharmD []  Peggyann Juba, PharmD []  Reuel Boom, PharmD   Positive urine culture Do not treat, Nuala Alpha, PA-C  Ardeen Fillers 10/02/2021, 10:06 AM

## 2021-10-04 LAB — CULTURE, BLOOD (ROUTINE X 2)
Culture: NO GROWTH
Culture: NO GROWTH
Special Requests: ADEQUATE
Special Requests: ADEQUATE

## 2021-10-21 ENCOUNTER — Emergency Department (HOSPITAL_COMMUNITY): Payer: Medicare HMO

## 2021-10-21 ENCOUNTER — Encounter (HOSPITAL_COMMUNITY): Payer: Self-pay | Admitting: *Deleted

## 2021-10-21 ENCOUNTER — Other Ambulatory Visit: Payer: Self-pay

## 2021-10-21 ENCOUNTER — Emergency Department (HOSPITAL_COMMUNITY)
Admission: EM | Admit: 2021-10-21 | Discharge: 2021-10-22 | Disposition: A | Discharge status: Home / Self Care | Payer: Medicare HMO | Attending: Emergency Medicine | Admitting: Emergency Medicine

## 2021-10-21 DIAGNOSIS — M542 Cervicalgia: Secondary | ICD-10-CM | POA: Insufficient documentation

## 2021-10-21 DIAGNOSIS — R0789 Other chest pain: Secondary | ICD-10-CM | POA: Insufficient documentation

## 2021-10-21 DIAGNOSIS — R509 Fever, unspecified: Secondary | ICD-10-CM | POA: Insufficient documentation

## 2021-10-21 DIAGNOSIS — Z20822 Contact with and (suspected) exposure to covid-19: Secondary | ICD-10-CM | POA: Diagnosis not present

## 2021-10-21 DIAGNOSIS — R519 Headache, unspecified: Secondary | ICD-10-CM | POA: Diagnosis not present

## 2021-10-21 DIAGNOSIS — D72829 Elevated white blood cell count, unspecified: Secondary | ICD-10-CM | POA: Diagnosis not present

## 2021-10-21 LAB — CBC WITH DIFFERENTIAL/PLATELET
Abs Immature Granulocytes: 0.06 10*3/uL (ref 0.00–0.07)
Basophils Absolute: 0 10*3/uL (ref 0.0–0.1)
Basophils Relative: 0 %
Eosinophils Absolute: 0.1 10*3/uL (ref 0.0–0.5)
Eosinophils Relative: 1 %
HCT: 37.9 % — ABNORMAL LOW (ref 39.0–52.0)
Hemoglobin: 12.4 g/dL — ABNORMAL LOW (ref 13.0–17.0)
Immature Granulocytes: 0 %
Lymphocytes Relative: 23 %
Lymphs Abs: 3.2 10*3/uL (ref 0.7–4.0)
MCH: 33.6 pg (ref 26.0–34.0)
MCHC: 32.7 g/dL (ref 30.0–36.0)
MCV: 102.7 fL — ABNORMAL HIGH (ref 80.0–100.0)
Monocytes Absolute: 1.9 10*3/uL — ABNORMAL HIGH (ref 0.1–1.0)
Monocytes Relative: 14 %
Neutro Abs: 8.4 10*3/uL — ABNORMAL HIGH (ref 1.7–7.7)
Neutrophils Relative %: 62 %
Platelets: 338 10*3/uL (ref 150–400)
RBC: 3.69 MIL/uL — ABNORMAL LOW (ref 4.22–5.81)
RDW: 13 % (ref 11.5–15.5)
WBC: 13.8 10*3/uL — ABNORMAL HIGH (ref 4.0–10.5)
nRBC: 0 % (ref 0.0–0.2)

## 2021-10-21 LAB — RESP PANEL BY RT-PCR (FLU A&B, COVID) ARPGX2
Influenza A by PCR: NEGATIVE
Influenza B by PCR: NEGATIVE
SARS Coronavirus 2 by RT PCR: NEGATIVE

## 2021-10-21 LAB — URINALYSIS, ROUTINE W REFLEX MICROSCOPIC
Glucose, UA: NEGATIVE mg/dL
Hgb urine dipstick: NEGATIVE
Ketones, ur: NEGATIVE mg/dL
Leukocytes,Ua: NEGATIVE
Nitrite: NEGATIVE
Protein, ur: NEGATIVE mg/dL
Specific Gravity, Urine: 1.023 (ref 1.005–1.030)
pH: 5 (ref 5.0–8.0)

## 2021-10-21 LAB — COMPREHENSIVE METABOLIC PANEL
ALT: 12 U/L (ref 0–44)
AST: 16 U/L (ref 15–41)
Albumin: 3.3 g/dL — ABNORMAL LOW (ref 3.5–5.0)
Alkaline Phosphatase: 110 U/L (ref 38–126)
Anion gap: 12 (ref 5–15)
BUN: 11 mg/dL (ref 8–23)
CO2: 26 mmol/L (ref 22–32)
Calcium: 8.7 mg/dL — ABNORMAL LOW (ref 8.9–10.3)
Chloride: 97 mmol/L — ABNORMAL LOW (ref 98–111)
Creatinine, Ser: 1.13 mg/dL (ref 0.61–1.24)
GFR, Estimated: >60 mL/min (ref >60)
Glucose, Bld: 157 mg/dL — ABNORMAL HIGH (ref 70–99)
Potassium: 4.4 mmol/L (ref 3.5–5.1)
Sodium: 135 mmol/L (ref 135–145)
Total Bilirubin: 0.5 mg/dL (ref 0.3–1.2)
Total Protein: 6.6 g/dL (ref 6.5–8.1)

## 2021-10-21 LAB — TROPONIN I (HIGH SENSITIVITY): Troponin I (High Sensitivity): 9 ng/L (ref <18)

## 2021-10-21 LAB — LACTIC ACID, PLASMA: Lactic Acid, Venous: 1.6 mmol/L (ref 0.5–1.9)

## 2021-10-21 MED ORDER — ACETAMINOPHEN 325 MG PO TABS
650.0000 mg | ORAL_TABLET | Freq: Once | ORAL | Status: AC
  Administered 2021-10-21: 650 mg via ORAL
  Filled 2021-10-21: qty 2

## 2021-10-21 MED ORDER — MORPHINE SULFATE (PF) 4 MG/ML IV SOLN
4.0000 mg | Freq: Once | INTRAVENOUS | Status: AC
  Administered 2021-10-21: 4 mg via INTRAVENOUS
  Filled 2021-10-21: qty 1

## 2021-10-21 NOTE — ED Provider Notes (Signed)
[facility] EMERGENCY DEPARTMENT Provider Note   CSN: [id number] Arrival date & time: 10/21/21  2107     History Chief Complaint  Patient presents with   Neck Pain    Kristopher [name] is a 80 y.o. male.  The history is provided by the patient.  Neck Pain He has a history of lumbar fusion last month and comes in because of neck pain for the last 5 days.  Pain is diffuse in the neck and does have some radiation down into the chest and upper back.  He rates pain at 8/10.  It has been getting worse.  He has been taking pain medication for his back surgery, but it is not helping his neck pain.  He denies any weakness or numbness or tingling.  He has had chills and has had low-grade fevers at home with maximum temperature of 100.4.  He has had nausea and did vomit once, but that was several days ago.  He has been constipated with no bowel movement in the last 4 days.  He went to an urgent care center today and was advised to come to the emergency department.   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   Allergic reaction to insect sting 06/21/2017   Toxic effect of venom of ants, unintentional 05/27/2017   GERD without esophagitis 03/27/2017   Allergy to insect bites 03/26/2017   Other allergic rhinitis 03/26/2017   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   Prediabetes 11/26/2016    Diverticulosis of large intestine 02/15/2016   History of adenomatous polyp of colon 11/21/2015    Past Surgical History:  Procedure Laterality Date   COLONOSCOPY     x 2   COLONOSCOPY WITH PROPOFOL N/A 03/05/2017   Procedure: COLONOSCOPY WITH PROPOFOL;  Surgeon: Garlan Fair, MD;  Location: [facility];  Service: Endoscopy;  Laterality: N/A;   EYE SURGERY     LUMBAR LAMINECTOMY  33   TONSILLECTOMY  age 39   TRANSFORAMINAL LUMBAR INTERBODY FUSION W/ MIS 2 LEVEL Right 09/26/2021   Procedure: Minimally Invasive Transforaminal Lumbar Interbody Fusion, Lumbar Four-Five, Lumbar Five-Sacral One, RIGHT SIDE APPROACH;  Surgeon: Karsten Ro, DO;  Location: [facility];  Service: Neurosurgery;  Laterality: Right;       Family History  Problem Relation Age of Onset   COPD Father    Congestive Heart Failure Father    CAD Father    Dementia Father    Pancreatic cancer Mother    GER disease Mother    Anxiety disorder Mother    Glaucoma Maternal Aunt    Colon cancer Maternal Uncle    Asthma Neg Hx    Eczema Neg Hx    Urticaria Neg Hx    Immunodeficiency Neg Hx    Angioedema  Neg Hx    Allergic rhinitis Neg Hx     Social History   Tobacco Use   Smoking status: Never   Smokeless tobacco: Never  Vaping Use   Vaping Use: Never used  Substance Use Topics   Alcohol use: No   Drug use: No    Home Medications Prior to Admission medications   Medication Sig Start Date End Date Taking? Authorizing Provider  ALPRAZolam Duanne Moron) 0.5 MG tablet Take 0.5 mg by mouth at bedtime. 10/20/19   [provider]  aluminum hydroxide-magnesium carbonate (GAVISCON) 95-358 MG/15ML SUSP Take 15 mLs by mouth daily as needed for indigestion or heartburn.    [provider]  Artificial Tear Solution (SOOTHE XP OP) Apply 1 drop to eye daily as needed (dry eyes).    [provider]  B Complex-C (SUPER B COMPLEX PO) Take 1 tablet by mouth daily.    [provider]   diphenhydramine-acetaminophen (TYLENOL PM) 25-500 MG TABS tablet Take 2 tablets by mouth at bedtime.     [provider]  EPINEPHrine 0.3 mg/0.3 mL IJ SOAJ injection Inject 0.3 mg into the muscle as needed for anaphylaxis. 05/29/17   [provider]  fluticasone (FLONASE) 50 MCG/ACT nasal spray Place 2 sprays into both nostrils at bedtime.     [provider]  gabapentin (NEURONTIN) 300 MG capsule Take 1 capsule (300 mg total) by mouth 3 (three) times daily. 09/29/21 10/29/21  Curatolo, Adam, DO  HYDROcodone-acetaminophen (NORCO/VICODIN) 5-325 MG tablet Take 1-2 tablets by mouth every 4 (four) hours as needed for moderate pain. Patient not taking: No sig reported 09/27/21 09/27/22  Marvis Moeller, NP  HYDROmorphone (DILAUDID) 2 MG tablet take 1-2 tablet by oral route  every 4 hours as needed 09/28/21   [provider]  Melatonin 10 MG TABS Take 1 tablet by mouth at bedtime as needed (sleep).    [provider]  methocarbamol (ROBAXIN-750) 750 MG tablet Take 1 tablet (750 mg total) by mouth 4 (four) times daily. 09/27/21   Marvis Moeller, NP  methylPREDNISolone (MEDROL DOSEPAK) 4 MG TBPK tablet Follow package insert 09/29/21   Curatolo, Adam, DO  naproxen sodium (ALEVE) 220 MG tablet Take 220 mg by mouth daily as needed.    [provider]  oxyCODONE (OXY IR/ROXICODONE) 5 MG immediate release tablet Take 1 tablet (5 mg total) by mouth every 6 (six) hours as needed for up to 20 doses for breakthrough pain. 09/29/21   Curatolo, Adam, DO  pantoprazole (PROTONIX) 20 MG tablet Take 1 tablet (20 mg total) by mouth daily before breakfast. 08/03/20   Gatha Mayer, MD    Allergies    Fire ant and Wasp venom  Review of Systems   Review of Systems  Musculoskeletal:  Positive for neck pain.  All other systems reviewed and are negative.  Physical Exam Updated Vital Signs BP (!) 170/89 (BP Location: Right Arm)   Pulse 97   Temp 99.8 F  (37.7 C) (Oral)   Resp 16   Ht 5\' 8"  (1.727 m)   Wt 90.7 kg   SpO2 96%   BMI 30.41 kg/m   Physical Exam Vitals and nursing note reviewed.  80 year old male, resting comfortably and in no acute distress. Vital signs are significant for elevated blood pressure. Oxygen saturation is 96%, which is normal. Head is normocephalic and atraumatic. PERRLA, EOMI. Oropharynx is clear. Neck is mildly tender diffusely.  There is pain on passive range of  motion in any direction, but no meningismus present.  There is no adenopathy or JVD.  There are no carotid bruits. Back: Recent surgical scar healing appropriately. Lungs are clear without rales, wheezes, or rhonchi. Chest is nontender. Heart has regular rate and rhythm without murmur. Abdomen is soft, flat, nontender without masses or hepatosplenomegaly and peristalsis is normoactive. Extremities have no cyanosis or edema, full range of motion is present. Skin is warm and dry without rash. Neurologic: Mental status is normal, cranial nerves are intact, strength is 5/5 in all 4 extremities, no sensory deficits.  ED Results / Procedures / Treatments   Labs (all labs ordered are listed, but only abnormal results are displayed) Labs Reviewed  COMPREHENSIVE METABOLIC PANEL - Abnormal; Notable for the following components:      Result Value   Chloride 97 (*)    Glucose, Bld 157 (*)    Calcium 8.7 (*)    Albumin 3.3 (*)    All other components within normal limits  CBC WITH DIFFERENTIAL/PLATELET - Abnormal; Notable for the following components:   WBC 13.8 (*)    RBC 3.69 (*)    Hemoglobin 12.4 (*)    HCT 37.9 (*)    MCV 102.7 (*)    Neutro Abs 8.4 (*)    Monocytes Absolute 1.9 (*)    All other components within normal limits  URINALYSIS, ROUTINE W REFLEX MICROSCOPIC - Abnormal; Notable for the following components:   Bilirubin Urine SMALL (*)    All other components within normal limits  RESP PANEL BY RT-PCR (FLU A&B, COVID) ARPGX2  LACTIC  ACID, PLASMA  LACTIC ACID, PLASMA  TROPONIN I (HIGH SENSITIVITY)  TROPONIN I (HIGH SENSITIVITY)    EKG EKG Interpretation  Date/Time:  Saturday October 21 2021 21:17:24 EST Ventricular Rate:  98 PR Interval:  210 QRS Duration: 88 QT Interval:  306 QTC Calculation: 390 R Axis:   -53 Text Interpretation: Sinus rhythm with 1st degree A-V block Left anterior fascicular block Abnormal ECG When compared with ECG of 02/13/2016, No significant change was found Confirmed by Delora Fuel ([id number]) on 10/21/2021 11:47:11 PM  Radiology DG Chest 2 View  Result Date: 10/21/2021 CLINICAL DATA:  Chest pain. EXAM: CHEST - 2 VIEW COMPARISON:  09/29/2021. FINDINGS: The heart size and mediastinal contours are within normal limits. Mild atelectasis is present at the lung bases. No effusion or pneumothorax is seen. Degenerative changes are present in the thoracic spine. IMPRESSION: Mild atelectasis at the left lung base. Electronically Signed   By: Brett Fairy M.D.   On: 10/21/2021 21:50   CT Head Wo Contrast  Result Date: 10/21/2021 CLINICAL DATA:  headache/neck pain. sent here with concern for meningitis; neck pain fall EXAM: CT HEAD WITHOUT CONTRAST CT CERVICAL SPINE WITHOUT CONTRAST TECHNIQUE: Multidetector CT imaging of the head and cervical spine was performed following the standard protocol without intravenous contrast. Multiplanar CT image reconstructions of the cervical spine were also generated. COMPARISON:  None. FINDINGS: CT HEAD FINDINGS Brain: Normal anatomic configuration. Parenchymal volume loss is commensurate with the patient's age. Mild periventricular white matter changes are present likely reflecting the sequela of small vessel ischemia. No abnormal intra or extra-axial mass lesion or fluid collection. No abnormal mass effect or midline shift. No evidence of acute intracranial hemorrhage or infarct. Ventricular size is normal. Cerebellum unremarkable. Vascular: No asymmetric hyperdense  vasculature at the skull base. Skull: Intact Sinuses/Orbits: Paranasal sinuses are clear. Orbits are unremarkable. Other: Mastoid air cells and middle ear cavities are  clear. CT CERVICAL SPINE FINDINGS Alignment: There is overall straightening of the cervical spine. There is 3-4 mm retrolisthesis of C6-7, likely degenerative in nature. Skull base and vertebrae: Craniocervical alignment is normal. The atlantodental interval is not widened. No acute fracture of the cervical spine. Vertebral body height is preserved. Soft tissues and spinal canal: No prevertebral fluid or swelling. No visible canal hematoma. No significant canal stenosis. 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 Changes of degenerative disc disease are also incidentally noted throughout the visualized thoracic spine. The prevertebral soft tissues are not thickened on sagittal reformats. Review of the axial images demonstrates multilevel uncovertebral and facet arthrosis resulting in mild left neuroforaminal narrowing at C3-4 and C5-6, moderate right neuroforaminal narrowing at C4-5, and mild bilateral neuroforaminal narrowing at C6-7. Upper chest: Dilation of the thoracic aorta is identified with the ascending segment measuring at least 4.0 cm in diameter, incompletely evaluated on this examination. Other: None IMPRESSION: No acute intracranial abnormality. No acute fracture or listhesis of the cervical spine. Ascending thoracic aortic aneurysm, incompletely evaluated on this examination. If indicated, this would be better assessed with dedicated CT imaging of the chest. Aortic aneurysm NOS (ICD10-I71.9). Electronically Signed   By: Fidela Salisbury M.D.   On: 10/21/2021 22:40   CT Cervical Spine Wo Contrast  Result Date: 10/21/2021 CLINICAL DATA:  headache/neck pain. sent here with concern for meningitis; neck pain fall EXAM:  CT HEAD WITHOUT CONTRAST CT CERVICAL SPINE WITHOUT CONTRAST TECHNIQUE: Multidetector CT imaging of the head and cervical spine was performed following the standard protocol without intravenous contrast. Multiplanar CT image reconstructions of the cervical spine were also generated. COMPARISON:  None. FINDINGS: CT HEAD FINDINGS Brain: Normal anatomic configuration. Parenchymal volume loss is commensurate with the patient's age. Mild periventricular white matter changes are present likely reflecting the sequela of small vessel ischemia. No abnormal intra or extra-axial mass lesion or fluid collection. No abnormal mass effect or midline shift. No evidence of acute intracranial hemorrhage or infarct. Ventricular size is normal. Cerebellum unremarkable. Vascular: No asymmetric hyperdense vasculature at the skull base. Skull: Intact Sinuses/Orbits: Paranasal sinuses are clear. Orbits are unremarkable. Other: Mastoid air cells and middle ear cavities are clear. CT CERVICAL SPINE FINDINGS Alignment: There is overall straightening of the cervical spine. There is 3-4 mm retrolisthesis of C6-7, likely degenerative in nature. Skull base and vertebrae: Craniocervical alignment is normal. The atlantodental interval is not widened. No acute fracture of the cervical spine. Vertebral body height is preserved. Soft tissues and spinal canal: No prevertebral fluid or swelling. No visible canal hematoma. No significant canal stenosis. Disc levels: There is intervertebral disc space narrowing and endplate remodeling of K7-Q2 in keeping with changes of moderate to severe degenerative disc disease. Changes of degenerative disc disease are also incidentally noted throughout the visualized thoracic spine. The prevertebral soft tissues are not thickened on sagittal reformats. Review of the axial images demonstrates multilevel uncovertebral and facet arthrosis resulting in mild left neuroforaminal narrowing at C3-4 and C5-6, moderate right  neuroforaminal narrowing at C4-5, and mild bilateral neuroforaminal narrowing at C6-7. Upper chest: Dilation of the thoracic aorta is identified with the ascending segment measuring at least 4.0 cm in diameter, incompletely evaluated on this examination. Other: None IMPRESSION: No acute intracranial abnormality. No acute fracture or listhesis of the cervical spine. Ascending thoracic aortic aneurysm, incompletely evaluated on this examination. If indicated, this would be  better assessed with dedicated CT imaging of the chest. Aortic aneurysm NOS (ICD10-I71.9). Electronically Signed   By: Fidela Salisbury M.D.   On: 10/21/2021 22:40    Procedures Procedures   Medications Ordered in ED Medications  acetaminophen (TYLENOL) tablet 650 mg (650 mg Oral Given 10/21/21 2355)  morphine 4 MG/ML injection 4 mg (4 mg Intravenous Given 10/21/21 2356)  morphine 4 MG/ML injection 4 mg (4 mg Intravenous Given 10/22/21 0321)  iohexol (OMNIPAQUE) 350 MG/ML injection 150 mL (150 mLs Intravenous Contrast Given 10/22/21 0315)  methocarbamol (ROBAXIN) 1,000 mg in dextrose 5 % 100 mL IVPB (0 mg Intravenous Stopped 10/22/21 0544)    ED Course  I have reviewed the triage vital signs and the nursing notes.  Pertinent labs & imaging results that were available during my care of the patient were reviewed by me and considered in my medical decision making (see chart for details).   MDM Rules/Calculators/A&P                         Neck pain and patient who is postop lumbar fusion, possible low-grade fevers at home.  He is afebrile here.  He is neurologically intact on exam.  ECG shows no acute changes, chest x-ray showed no acute changes other than mild atelectasis.  CT of head showed no acute abnormality, CT of cervical spine was significant for findings of a thoracic aortic aneurysm.  I am concerned that there could actually be some dissection from the thoracic aneurysm into the neck to account for some of his symptoms, so  we will send for CT angiogram of the chest and neck.  Labs showed mild leukocytosis without any left shift, normal lactic acid level, mild hyperglycemia.  Old records are reviewed confirming lumbar fusion surgery on 09/26/2021.  CT angiogram of the chest and neck actually shows no aneurysm present, no acute process.  He was given a dose of intravenous methocarbamol with significant improvement in his pain.  He appears to have a flulike illness which is worsened his pain, no clinical signs to suggest meningitis.  Case was discussed with Dr. Christella Noa, on-call for neurosurgery, who agrees with above-noted management.  He is already taking methocarbamol at home, he is to continue taking it as well as his prescription narcotic medication.  He is instructed to use over-the-counter analgesics and antipyretics as needed.  Return if symptoms worsen.  Final Clinical Impression(s) / ED Diagnoses Final diagnoses:  Neck pain  Fever, unspecified fever cause    Rx / DC Orders ED Discharge Orders     None        Delora Fuel, MD 81/82/99 [phone]

## 2021-10-21 NOTE — ED Provider Notes (Signed)
Emergency Medicine Provider Triage Evaluation Note  Kristopher [name] , a 80 y.o. male  was evaluated in triage.  Pt complains of gradual onset, constant, achy, neck pain for the past week.  Patient reports recent lumbar fusion on 10/18.  He had been doing well at home until he developed neck pain a week ago.  Family member also reports low-grade fevers at home.  He has been taking his regular pain medicine for his back pain however does not touch his neck pain.  He states that he went to his primary care doctor today and they were concerned that he could have meningitis.  Family member reports that he has been acting at baseline.  They have not noticed any speech changes or confusion.  No rash.  He states that he was tested for the flu today and it was negative.  States that at times the neck pain will radiate into his chest and he is having some mild chest pain currently.  He also mentions that he vomited a couple of days ago.  No abdominal pain.  Review of Systems  Positive: + neck pain, fevers, chest pain Negative: - AMS, unilateral weakness or numbness, speech changes, rash, cough, body aches, sore throat, abd pain  Physical Exam  BP (!) 170/89 (BP Location: Right Arm)   Pulse 97   Temp 99.8 F (37.7 C) (Oral)   Resp 16   Ht 5\' 8"  (1.727 m)   Wt 90.7 kg   SpO2 96%   BMI 30.41 kg/m  Gen:   Awake, no distress   Resp:  Normal effort  MSK:   Moves extremities without difficulty  Other:  + bilateral paracervical musculature TTP without midline C spine TTP; ROM limited s/2 pain. CN's intact. Normal finger to nose. No pronator drift. Strength 5/5 to BUE and BLEs. Sensation intact throughout. A &O x 4.   Medical Decision Making  Medically screening exam initiated at 9:23 PM.  Appropriate orders placed.  Kristopher [name] was informed that the remainder of the evaluation will be completed by another provider, this initial triage assessment does not replace that evaluation, and the importance of  remaining in the ED until their evaluation is complete.     Kristopher Maize, PA-C 85/92/92 [id number]    Delora Fuel, MD 86/38/17 [phone]

## 2021-10-21 NOTE — ED Triage Notes (Addendum)
PT states lower back surgery on 10/18.  Has been feeling fine until Monday when pain started radiating from upper neck to base of skull.  Was seen at [facility] today and they told him, "he may have meningitis".  Denies loss of control/numbness to extremities.  Pt now c/o chest and L armpit pain.

## 2021-10-22 ENCOUNTER — Emergency Department (HOSPITAL_COMMUNITY): Payer: Medicare HMO

## 2021-10-22 LAB — TROPONIN I (HIGH SENSITIVITY): Troponin I (High Sensitivity): 9 ng/L (ref <18)

## 2021-10-22 LAB — LACTIC ACID, PLASMA: Lactic Acid, Venous: 1.4 mmol/L (ref 0.5–1.9)

## 2021-10-22 MED ORDER — METHOCARBAMOL 1000 MG/10ML IJ SOLN
1000.0000 mg | Freq: Once | INTRAVENOUS | Status: AC
  Administered 2021-10-22: 1000 mg via INTRAVENOUS
  Filled 2021-10-22: qty 10

## 2021-10-22 MED ORDER — MORPHINE SULFATE (PF) 4 MG/ML IV SOLN
4.0000 mg | Freq: Once | INTRAVENOUS | Status: AC
  Administered 2021-10-22: 4 mg via INTRAVENOUS
  Filled 2021-10-22: qty 1

## 2021-10-22 MED ORDER — IOHEXOL 350 MG/ML SOLN
150.0000 mL | Freq: Once | INTRAVENOUS | Status: AC | PRN
  Administered 2021-10-22: 150 mL via INTRAVENOUS

## 2021-10-22 NOTE — Discharge Instructions (Addendum)
Continue taking all of your home medications, including methocarbamol (Robaxin).  Apply ice for 30 minutes at a time, 4 times a day.  Take ibuprofen and/your acetaminophen as needed for fever or aching.  Return if symptoms are getting worse.

## 2021-10-22 NOTE — ED Notes (Signed)
Patient transported to CT 

## 2022-07-26 ENCOUNTER — Emergency Department (HOSPITAL_COMMUNITY)
Admission: EM | Admit: 2022-07-26 | Discharge: 2022-07-26 | Disposition: A | Discharge status: Home / Self Care | Payer: Medicare HMO | Attending: Emergency Medicine | Admitting: Emergency Medicine

## 2022-07-26 ENCOUNTER — Emergency Department (HOSPITAL_COMMUNITY): Payer: Medicare HMO

## 2022-07-26 ENCOUNTER — Other Ambulatory Visit: Payer: Self-pay

## 2022-07-26 DIAGNOSIS — D72829 Elevated white blood cell count, unspecified: Secondary | ICD-10-CM | POA: Insufficient documentation

## 2022-07-26 DIAGNOSIS — R7989 Other specified abnormal findings of blood chemistry: Secondary | ICD-10-CM | POA: Diagnosis not present

## 2022-07-26 DIAGNOSIS — U071 COVID-19: Secondary | ICD-10-CM | POA: Insufficient documentation

## 2022-07-26 DIAGNOSIS — R059 Cough, unspecified: Secondary | ICD-10-CM | POA: Diagnosis present

## 2022-07-26 LAB — COMPREHENSIVE METABOLIC PANEL
ALT: 15 U/L (ref 0–44)
AST: 22 U/L (ref 15–41)
Albumin: 4.2 g/dL (ref 3.5–5.0)
Alkaline Phosphatase: 69 U/L (ref 38–126)
Anion gap: 12 (ref 5–15)
BUN: 16 mg/dL (ref 8–23)
CO2: 21 mmol/L — ABNORMAL LOW (ref 22–32)
Calcium: 9.2 mg/dL (ref 8.9–10.3)
Chloride: 103 mmol/L (ref 98–111)
Creatinine, Ser: 1.45 mg/dL — ABNORMAL HIGH (ref 0.61–1.24)
GFR, Estimated: 48 mL/min — ABNORMAL LOW (ref >60)
Glucose, Bld: 154 mg/dL — ABNORMAL HIGH (ref 70–99)
Potassium: 4.5 mmol/L (ref 3.5–5.1)
Sodium: 136 mmol/L (ref 135–145)
Total Bilirubin: 0.9 mg/dL (ref 0.3–1.2)
Total Protein: 6.9 g/dL (ref 6.5–8.1)

## 2022-07-26 LAB — CBC WITH DIFFERENTIAL/PLATELET
Abs Immature Granulocytes: 0.07 10*3/uL (ref 0.00–0.07)
Basophils Absolute: 0 10*3/uL (ref 0.0–0.1)
Basophils Relative: 0 %
Eosinophils Absolute: 0 10*3/uL (ref 0.0–0.5)
Eosinophils Relative: 0 %
HCT: 41.1 % (ref 39.0–52.0)
Hemoglobin: 14.6 g/dL (ref 13.0–17.0)
Immature Granulocytes: 1 %
Lymphocytes Relative: 7 %
Lymphs Abs: 0.8 10*3/uL (ref 0.7–4.0)
MCH: 35.3 pg — ABNORMAL HIGH (ref 26.0–34.0)
MCHC: 35.5 g/dL (ref 30.0–36.0)
MCV: 99.3 fL (ref 80.0–100.0)
Monocytes Absolute: 1.4 10*3/uL — ABNORMAL HIGH (ref 0.1–1.0)
Monocytes Relative: 13 %
Neutro Abs: 8.9 10*3/uL — ABNORMAL HIGH (ref 1.7–7.7)
Neutrophils Relative %: 79 %
Platelets: 240 10*3/uL (ref 150–400)
RBC: 4.14 MIL/uL — ABNORMAL LOW (ref 4.22–5.81)
RDW: 12.7 % (ref 11.5–15.5)
WBC: 11.2 10*3/uL — ABNORMAL HIGH (ref 4.0–10.5)
nRBC: 0 % (ref 0.0–0.2)

## 2022-07-26 LAB — CK: Total CK: 146 U/L (ref 49–397)

## 2022-07-26 LAB — TROPONIN I (HIGH SENSITIVITY)
Troponin I (High Sensitivity): 11 ng/L (ref <18)
Troponin I (High Sensitivity): 14 ng/L (ref <18)

## 2022-07-26 MED ORDER — NIRMATRELVIR/RITONAVIR (PAXLOVID) TABLET (RENAL DOSING): 2.0000 | ORAL_TABLET | Freq: Two times a day (BID) | ORAL | 0 refills | Status: AC

## 2022-07-26 MED ORDER — ACETAMINOPHEN 325 MG PO TABS
650.0000 mg | ORAL_TABLET | Freq: Once | ORAL | Status: AC
Start: 2022-07-26 — End: 2022-07-26
  Administered 2022-07-26: 650 mg via ORAL
  Filled 2022-07-26: qty 2

## 2022-07-26 MED ORDER — LACTATED RINGERS IV BOLUS
1000.0000 mL | Freq: Once | INTRAVENOUS | Status: AC
Start: 2022-07-26 — End: 2022-07-26
  Administered 2022-07-26: 1000 mL via INTRAVENOUS

## 2022-07-26 MED ORDER — NIRMATRELVIR/RITONAVIR (PAXLOVID) TABLET (RENAL DOSING)
2.0000 | ORAL_TABLET | Freq: Two times a day (BID) | ORAL | Status: DC
  Administered 2022-07-26: 2 via ORAL
  Filled 2022-07-26: qty 20

## 2022-07-26 NOTE — Discharge Instructions (Addendum)
You were evaluated in the emergency room for illness due to COVID-19.  I do not think you have bacterial pneumonia.  Your condition does not warrant admission to the hospital.  Please follow-up with your primary care doctor outside of the hospital.  Continue taking Paxlovid for 4 days after leaving the hospital.  Call your physician or return to the emergency department if your condition does not improve after a few days, or begins to worsen.  Signs and symptoms of serious illness warranting immediate evaluation in the emergency department include, but are not limited to: New or worsening shortness of breath, chest pain, fever greater than 100.4 F that does not respond to Tylenol or ibuprofen, confusion.

## 2022-07-26 NOTE — ED Notes (Signed)
Discharge instructions reviewed with patient. Patient verbalized understanding of instructions. Follow-up care and medications were reviewed. Patient ambulatory with steady gait. VSS upon discharge.  ?

## 2022-07-26 NOTE — ED Provider Triage Note (Signed)
Emergency Medicine Provider Triage Evaluation Note  Kristopher [name] , a 81 y.o. male  was evaluated in triage.  Pt complains of COVID-positive.  Patient reports he feels short of breath, cough, body aches, sore throat yesterday but none today.  Was seen in urgent care and was sent over due to 91% on room air.  Patient was placed on 2 L and is now at 96%.  Review of Systems  Positive:  Negative:   Physical Exam  BP 108/71   Pulse 87   Temp 98.2 F (36.8 C)   Resp 16   SpO2 96%  Gen:   Awake, no distress   Resp:  Normal effort  MSK:   Moves extremities without difficulty  Other:  Lungs are diminished.  Patient speaking in full sentences.  Medical Decision Making  Medically screening exam initiated at 5:16 PM.  Appropriate orders placed.  Hersh Minney was informed that the remainder of the evaluation will be completed by another provider, this initial triage assessment does not replace that evaluation, and the importance of remaining in the ED until their evaluation is complete.  Labs and imaging ordered   Sherrell Puller, [place] 07/26/22 1718

## 2022-07-26 NOTE — ED Provider Notes (Addendum)
[facility] EMERGENCY DEPARTMENT Provider Note   CSN: [id number] Arrival date & time: 07/26/22  1634     History  Chief Complaint  Patient presents with   Covid Positive   Shortness of Breath    Kristopher [name] is a 81 y.o. male who came to the emergency department after being referred by his outpatient physician for hypoxia in the setting of COVID-19.  Today began feeling really ill with significant muscle aches, dry cough, some shortness of breath, fevers to 102 F measured at home.  He went to urgent care where he was found to have SPO2 of 91% on room air.  Outpatient physician also heard crackles in the left lower lobe.  He referred him urgently to the emergency department.  On arrival he satting at 100% on 2 L via nasal cannula.  He denies shortness of breath.  He reports mild chest pain that resolved, that he thought was related to coughing.  No prior history of heart or lung disease.  Patient is a non-smoker.   Shortness of Breath      Home Medications Prior to Admission medications   Medication Sig Start Date End Date Taking? Authorizing Provider  nirmatrelvir/ritonavir EUA, renal dosing, (PAXLOVID) 10 x 150 MG & 10 x '100MG'$  TABS Take 2 tablets by mouth 2 (two) times daily for 4 days. Patient GFR is 50. Take nirmatrelvir (150 mg) one tablet twice daily for 4 days and ritonavir (100 mg) one tablet twice daily for 4 days. 07/26/22 07/30/22 Yes Nani Gasser, MD  ALPRAZolam Duanne Moron) 0.5 MG tablet Take 0.5 mg by mouth at bedtime. 10/20/19   [provider]  aluminum hydroxide-magnesium carbonate (GAVISCON) 95-358 MG/15ML SUSP Take 15 mLs by mouth daily as needed for indigestion or heartburn.    [provider]  Artificial Tear Solution (SOOTHE XP OP) Apply 1 drop to eye daily as needed (dry eyes).    [provider]  B Complex-C (SUPER B COMPLEX PO) Take 1 tablet by mouth daily.    [provider]  EPINEPHrine 0.3 mg/0.3 mL IJ  SOAJ injection Inject 0.3 mg into the muscle as needed for anaphylaxis. 05/29/17   [provider]  gabapentin (NEURONTIN) 300 MG capsule Take 1 capsule (300 mg total) by mouth 3 (three) times daily. 09/29/21 10/29/21  Curatolo, Adam, DO  HYDROmorphone (DILAUDID) 2 MG tablet Take 2-4 mg by mouth See admin instructions. 2 mg 4 times daily 4 mg twice daily 09/28/21   [provider]  Melatonin 10 MG TABS Take 1 tablet by mouth at bedtime as needed (sleep).    [provider]  methocarbamol (ROBAXIN-750) 750 MG tablet Take 1 tablet (750 mg total) by mouth 4 (four) times daily. Patient taking differently: Take 1,500 mg by mouth 4 (four) times daily. 09/27/21   Marvis Moeller, NP  naloxone [facility]) nasal spray 4 mg/0.1 mL Place 4 mg into the nose as needed (accidental overdose). 09/29/21   [provider]  naproxen sodium (ALEVE) 220 MG tablet Take 220 mg by mouth daily as needed (pain).    [provider]  oxyCODONE (OXY IR/ROXICODONE) 5 MG immediate release tablet Take 1 tablet (5 mg total) by mouth every 6 (six) hours as needed for up to 20 doses for breakthrough pain. 09/29/21   Curatolo, Adam, DO  pantoprazole (PROTONIX) 20 MG tablet Take 1 tablet (20 mg total) by mouth daily before breakfast. 08/03/20   Gatha Mayer, MD  Allergies    Fire ant and Wasp venom    Review of Systems   Review of Systems  Respiratory:  Positive for shortness of breath.   All other systems reviewed and are negative.   Physical Exam Updated Vital Signs BP 131/70   Pulse 65   Temp 98.2 F (36.8 C) (Oral)   Resp (!) 23   SpO2 96%  Physical Exam Vitals and nursing note reviewed.  Constitutional:      General: He is not in acute distress.    Appearance: He is well-developed.  HENT:     Head: Normocephalic and atraumatic.  Eyes:     Conjunctiva/sclera: Conjunctivae normal.  Cardiovascular:     Rate and Rhythm: Normal rate and regular rhythm.     Heart  sounds: No murmur heard. Pulmonary:     Effort: Pulmonary effort is normal. No tachypnea, accessory muscle usage or respiratory distress.     Comments: Crackles left lower lobe that clear with cough. Abdominal:     Palpations: Abdomen is soft.     Tenderness: There is no abdominal tenderness.  Musculoskeletal:        General: No swelling.     Cervical back: Neck supple.  Skin:    General: Skin is warm and dry.     Capillary Refill: Capillary refill takes less than 2 seconds.  Neurological:     Mental Status: He is alert.  Psychiatric:        Mood and Affect: Mood normal.     ED Results / Procedures / Treatments   Labs (all labs ordered are listed, but only abnormal results are displayed) Labs Reviewed  CBC WITH DIFFERENTIAL/PLATELET - Abnormal; Notable for the following components:      Result Value   WBC 11.2 (*)    RBC 4.14 (*)    MCH 35.3 (*)    Neutro Abs 8.9 (*)    Monocytes Absolute 1.4 (*)    All other components within normal limits  COMPREHENSIVE METABOLIC PANEL - Abnormal; Notable for the following components:   CO2 21 (*)    Glucose, Bld 154 (*)    Creatinine, Ser 1.45 (*)    GFR, Estimated 48 (*)    All other components within normal limits  CK  URINALYSIS, ROUTINE W REFLEX MICROSCOPIC  TROPONIN I (HIGH SENSITIVITY)  TROPONIN I (HIGH SENSITIVITY)    EKG EKG Interpretation  Date/Time:  Thursday July 26 2022 16:56:14 EDT Ventricular Rate:  85 PR Interval:  238 QRS Duration: 86 QT Interval:  346 QTC Calculation: 411 R Axis:   -59 Text Interpretation: Sinus rhythm with 1st degree A-V block Left anterior fascicular block Possible Anterolateral infarct , age undetermined When compared with ECG of 21-Oct-2021 21:17, PREVIOUS ECG IS PRESENT No significant change since last tracing Confirmed by Blanchie Dessert [phone]) on 07/26/2022 8:40:23 PM  Radiology DG Chest 2 View  Result Date: 07/26/2022 CLINICAL DATA:  Low O2 levels EXAM: CHEST - 2 VIEW  COMPARISON:  Chest x-ray dated October 21, 2021 FINDINGS: The heart size and mediastinal contours are within normal limits. Mild left basilar opacity, likely due to atelectasis. Lungs are otherwise clear. No pleural effusion or pneumothorax. IMPRESSION: Mild left basilar opacity, likely due to atelectasis. Lungs are otherwise clear. Electronically Signed   By: Yetta Glassman M.D.   On: 07/26/2022 18:01    Procedures Procedures  Continuous cardiac monitoring showed normal sinus rhythm  Medications Ordered in ED Medications  nirmatrelvir/ritonavir EUA (renal dosing) (PAXLOVID) 2  tablet (2 tablets Oral Given 07/26/22 2302)  lactated ringers bolus 1,000 mL (0 mLs Intravenous Stopped 07/26/22 2242)  acetaminophen (TYLENOL) tablet 650 mg (650 mg Oral Given 07/26/22 2129)    ED Course/ Medical Decision Making/ A&P                           Medical Decision Making Risk OTC drugs.   This patient is a 81 year old male in good health who presents with fevers, cough, myalgias in the setting of COVID-19.  On exam he is satting well on 2 L via nasal cannula and is not in respiratory distress.  Afebrile and hemodynamically stable.  Very slight crackles left lower lobe.  Mild left basilar opacity on CXR but no obvious infiltrates.  Also with mild AKI.  COVID-19 is probably driving this patient's current clinical syndrome.  Time course of this patient's illness is more consistent with an acute viral infection rather than bacterial pneumonia.  This patient is not in acute hypoxic respiratory failure.  I will begin treating this patient for COVID-19 pneumonia.  I will rehydrate for AKI in setting of poor p.o. intake over the last day.  Discontinue oxygen and reassess patient. Patient's ambulatory SpO2 remained above 94%. Will discharge home with 4 days of Paxlovid to complete a 5 day course.  Social Determinants of Health:  Lives in [facility]   Additional history obtained:  Additional  history and/or information obtained from wife and daughter, at bedside. External records from outside source obtained and reviewed including charts from prior hospital encounters.   Lab Tests:  I Ordered (or co-signed), and personally interpreted labs.  The pertinent results include:   CBC with mild leukocytosis CMP with mildly elevated creatinine from baseline.   Imaging Studies ordered:  I ordered (or co-signed) imaging studies including CXR. I independently visualized and interpreted imaging which showed mild left basilar opacity I agree with the radiologist interpretation   Cardiac Monitoring:  The patient was maintained on a cardiac monitor.  The cardiac monitored showed an rhythm of normal sinus rhythm. The patient was also maintained on pulse oximetry. The readings were typically within normal limits.   Medicines ordered and prescription drug management:  I ordered medication including LR 1000 mL IV, Paxlovid, acetaminophen 650 mg p.o. Reevaluation of the patient after these medicines showed that the patient improved  Reevaluation:  After the interventions noted above, I reevaluated the patient and found that they have :improved.   Dispostion:  After consideration of the diagnostic results and the patients response to treatment, I feel that the patent would benefit from discharge home with Paxlovid to complete 5 day course and PCP follow-up.          Final Clinical Impression(s) / ED Diagnoses Final diagnoses:  [id number]    Rx / DC Orders ED Discharge Orders          Ordered    nirmatrelvir/ritonavir EUA, renal dosing, (PAXLOVID) 10 x 150 MG & 10 x '100MG'$  TABS  2 times daily        07/26/22 2300              Nani Gasser, MD 07/26/22 2322    Blanchie Dessert, MD 07/27/22 [phone]

## 2022-07-26 NOTE — ED Triage Notes (Signed)
Pt c/o fatigue, SHOB, generalized bodyaches, low O2 levels, was diagnosed covid+ today at [facility]. Sent to ED r/t O2 levels; 91% on RA; placed on 2L, O2 increased to 96%

## 2022-08-14 IMAGING — CR DG CHEST 2V
2 series · 2 of 2 positions shown · non-contrast
Comparison: 09/29/2021.

CLINICAL DATA: Chest pain.

EXAM:
CHEST - 2 VIEW

[chest pa]
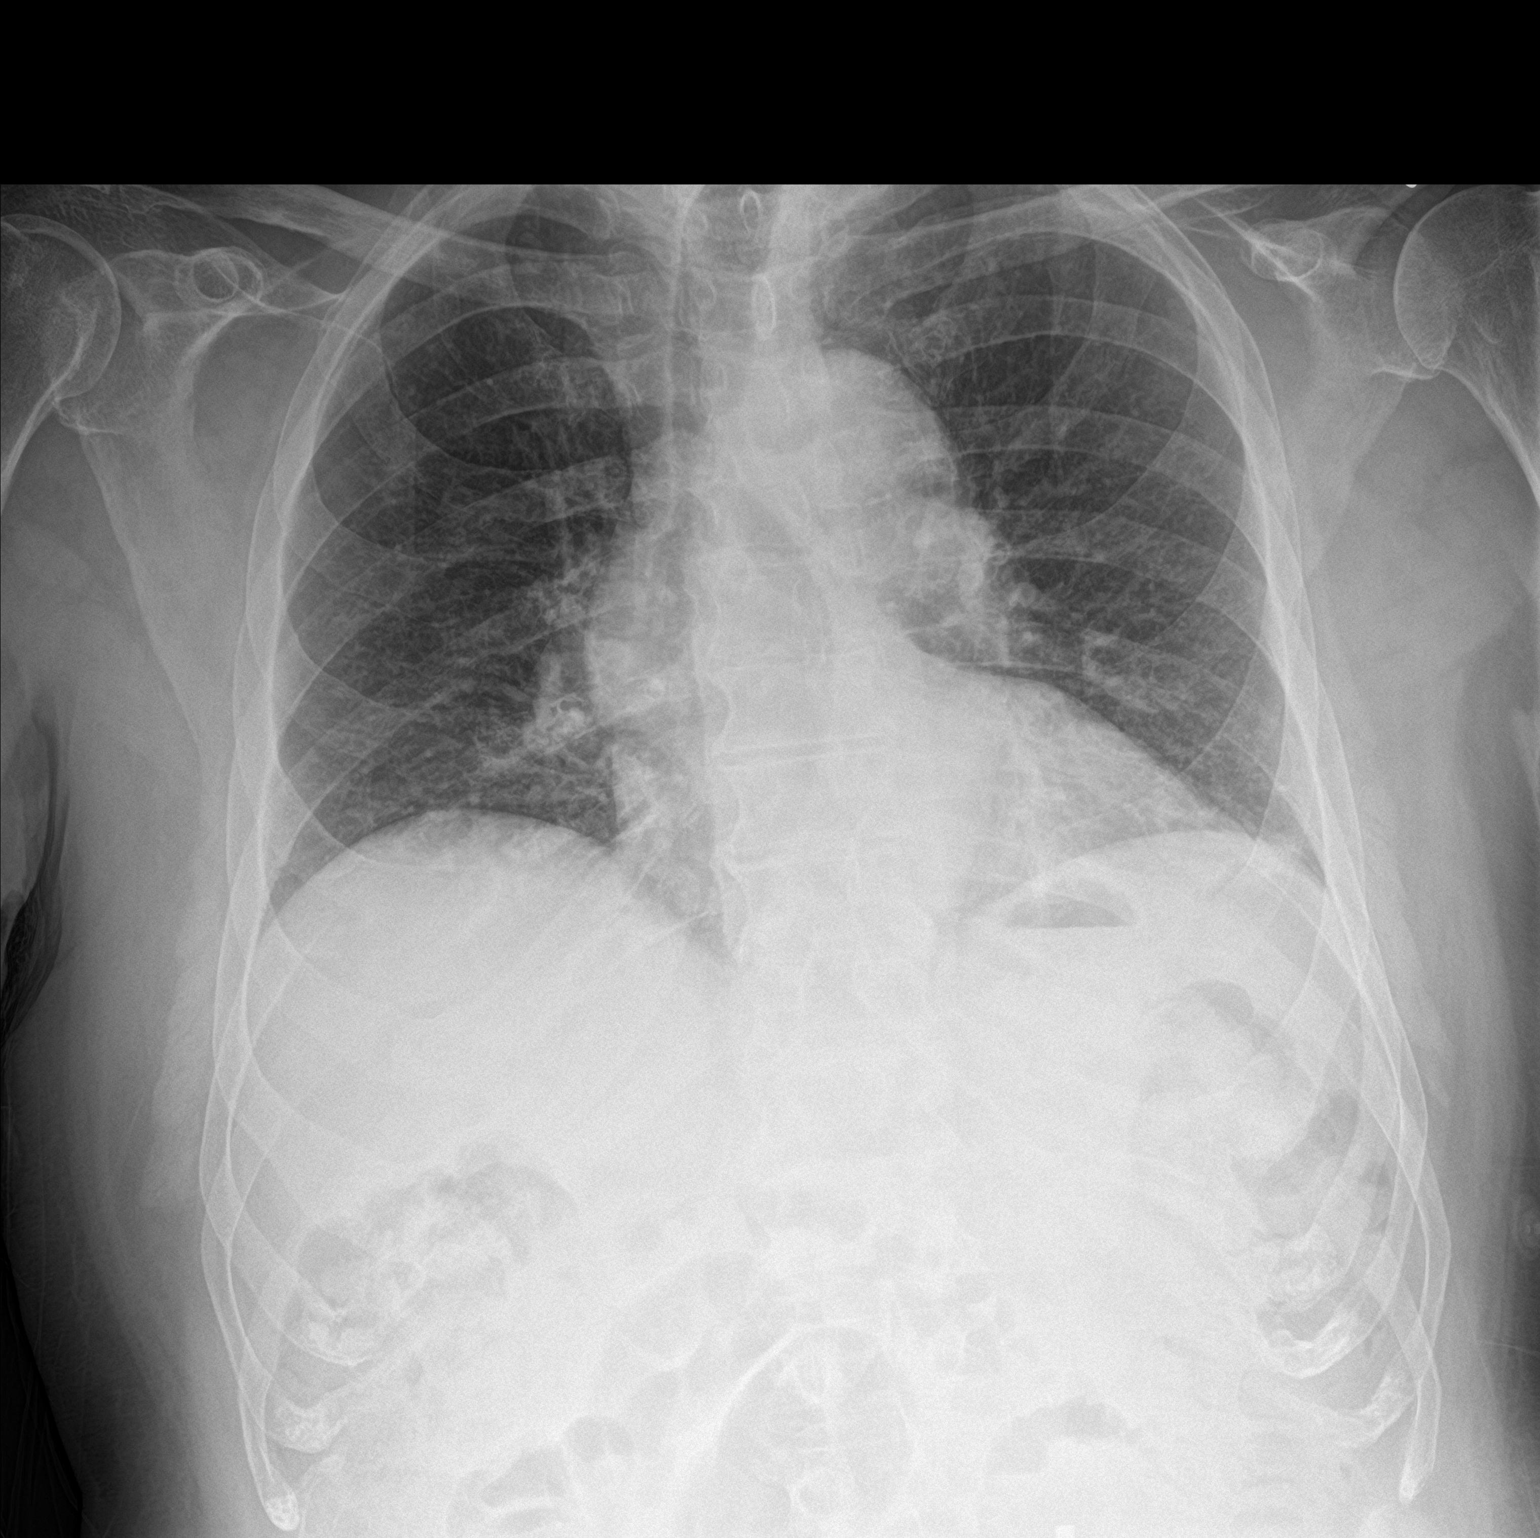

[chest lat]
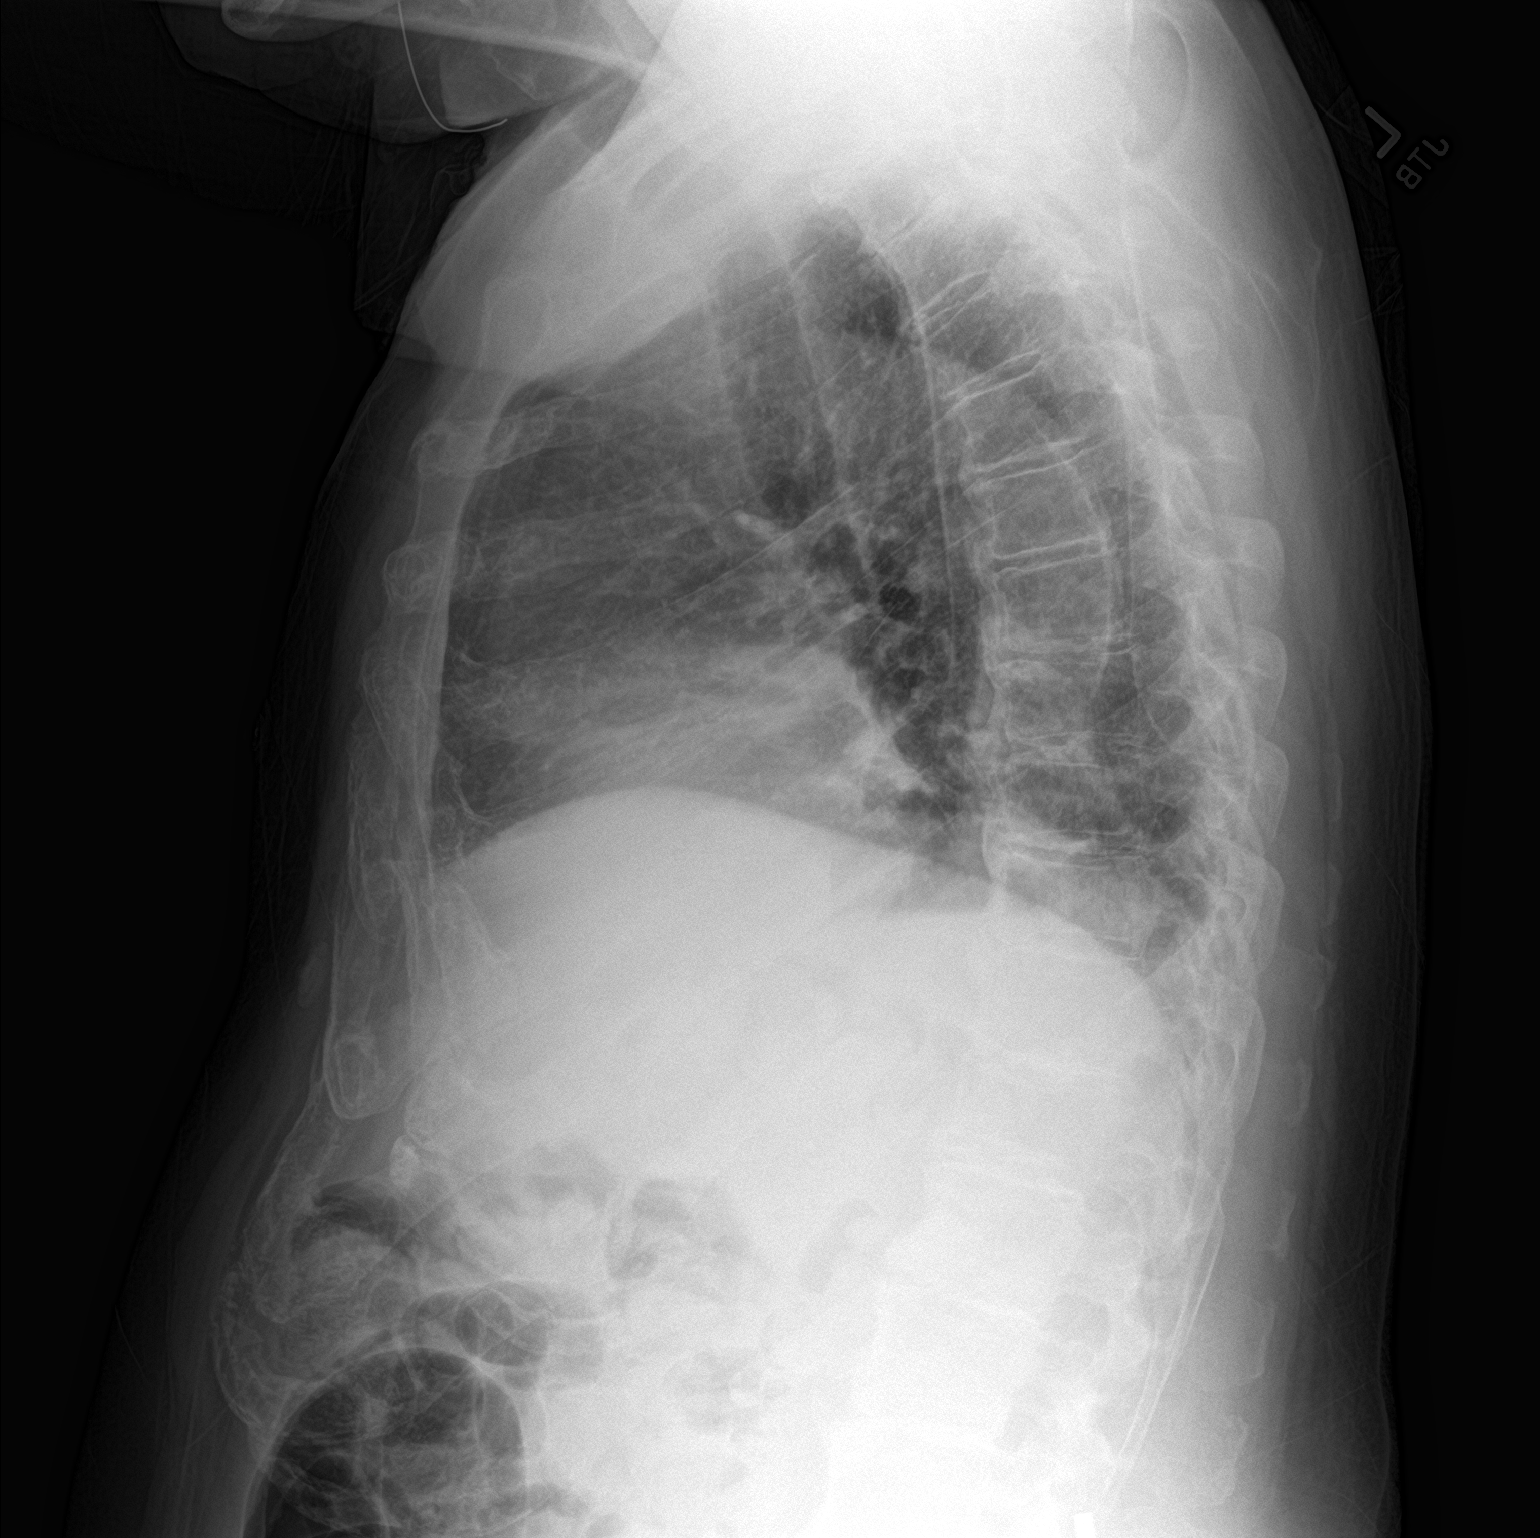

[2 of 2 positions shown; findings below may reference images not displayed]

FINDINGS: The heart size and mediastinal contours are within normal limits.
Mild atelectasis is present at the lung bases. No effusion or
pneumothorax is seen. Degenerative changes are present in the
thoracic spine.
IMPRESSION: Mild atelectasis at the left lung base.

## 2022-08-15 IMAGING — CT CT ANGIO NECK
2 of 7 series · 8 of 33 positions shown · IV contrast (OMNI 350)
Comparison: None.

CLINICAL DATA: Initial evaluation for carotid artery aneurysm.

EXAM:
CT ANGIOGRAPHY NECK
TECHNIQUE: Multidetector CT imaging of the neck was performed using the
standard protocol during bolus administration of intravenous
contrast. Multiplanar CT image reconstructions and MIPs were
obtained to evaluate the vascular anatomy. Carotid stenosis
measurements (when applicable) are obtained utilizing NASCET
criteria, using the distal internal carotid diameter as the
denominator.
CONTRAST:  150mL OMNIPAQUE IOHEXOL 350 MG/ML SOLN

[Series 5: cta neck · axial · 0.46mm/px · z∈[-502,-418]mm · 2 of 127 slices shown]
[im 43/127  soft-tissue]
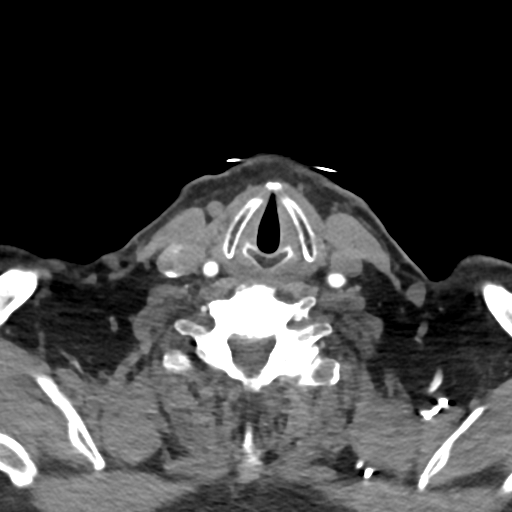
[im 85/127  soft-tissue]
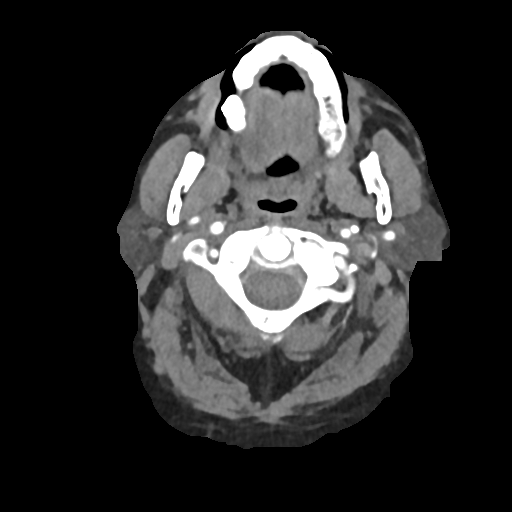

[Series 7: cta neck axial · axial · 0.45mm/px · z∈[-549,-369]mm · 6 of 253 slices shown]
[im 37/253  soft-tissue]
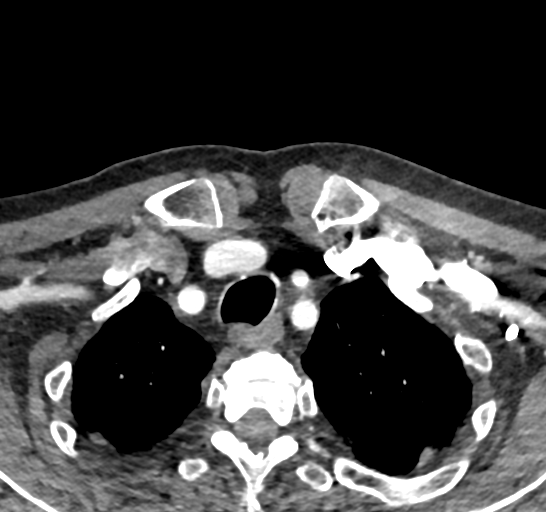
[im 73/253  bone]
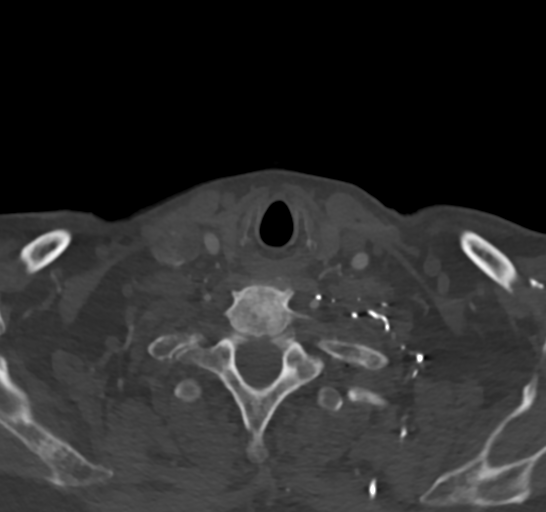
[im 109/253  soft-tissue]
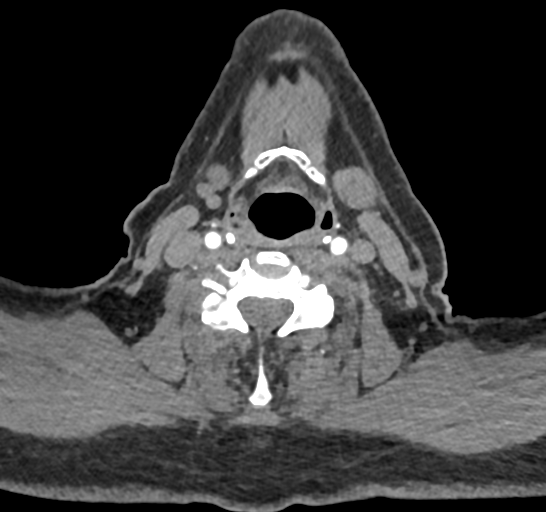
[im 145/253  bone]
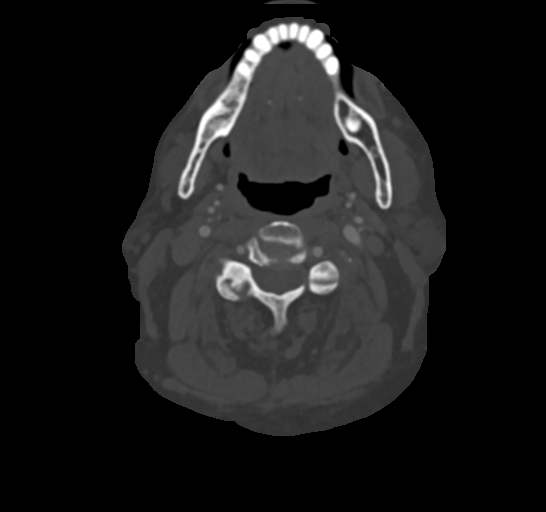
[im 181/253  soft-tissue]
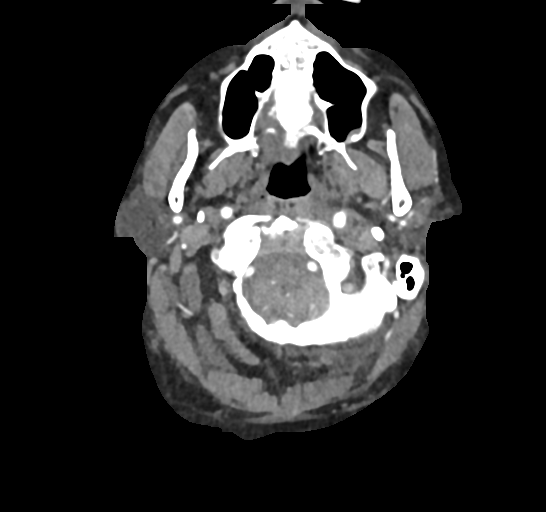
[im 217/253  bone]
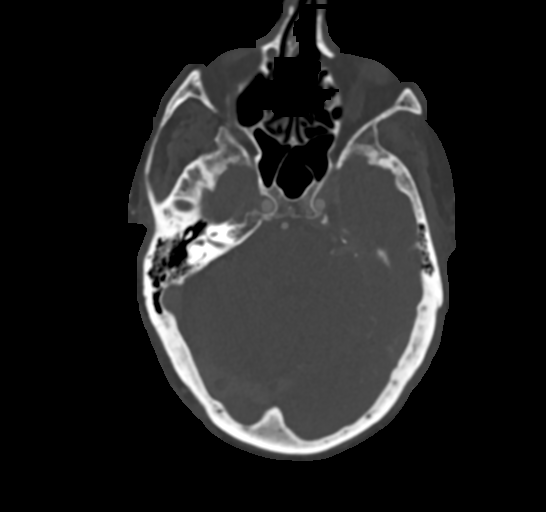

[8 of 33 positions shown; findings below may reference images not displayed]

FINDINGS: Aortic arch: Visualized aortic arch normal in caliber with normal
branch pattern. Mild atheromatous change about the arch and origin
of the great vessels without significant stenosis.

Right carotid system: Right CCA patent without stenosis. Eccentric
calcified plaque at the right carotid bulb without significant
stenosis. Right ICA patent distally without stenosis, dissection or
occlusion.

Left carotid system: Left CCA patent without stenosis. Eccentric
mixed plaque about the left carotid bulb/proximal left ICA without
hemodynamically significant stenosis. Left ICA patent distally
without stenosis dissection or occlusion.

Vertebral arteries: Both vertebral arteries arise from the
subclavian arteries. No significant proximal subclavian artery
stenosis. Left vertebral artery slightly dominant. Atheromatous
plaque at the origin of the right vertebral artery with associated
severe ostial stenosis (series 7, image 203). Right vertebral artery
otherwise patent. On the left, there is a focal moderate stenosis
involving the left V2 segment at the level of the C3 transverse
foramen due to extrinsic compression by uncovertebral and facet
disease (series 7, image 123). Left vertebral otherwise patent. No
evidence for dissection or other acute vascular abnormality.

Skeleton: No worrisome lytic or blastic osseous lesions. Moderate
spondylosis present at C5-6 and C6-7.

Other neck: No other acute soft tissue abnormality within the neck.

Upper chest: Better evaluated on concomitant CT of the chest.
IMPRESSION: 1. Negative CTA for dissection, aneurysm, or other acute vascular
abnormality.
2. Atheromatous plaque at the origin of the right vertebral artery
with associated severe ostial stenosis.
3. Short-segment moderate stenosis involving the left V2 segment at
the level of the C3 transverse foramen due to extrinsic compression
by facet disease.
4. Mild atheromatous change about the carotid bifurcations without
hemodynamically significant stenosis.

## 2023-06-17 ENCOUNTER — Encounter: Payer: Self-pay | Admitting: Diagnostic Neuroimaging

## 2023-06-17 ENCOUNTER — Ambulatory Visit: Payer: Medicare HMO | Admitting: Diagnostic Neuroimaging

## 2023-06-17 VITALS — BP 121/69 | HR 66 | Ht 69.0 in | Wt 201.2 lb

## 2023-06-17 DIAGNOSIS — R202 Paresthesia of skin: Secondary | ICD-10-CM | POA: Diagnosis not present

## 2023-06-17 DIAGNOSIS — R200 Anesthesia of skin: Secondary | ICD-10-CM | POA: Diagnosis not present

## 2023-06-17 NOTE — Progress Notes (Signed)
[facility]  PATIENT: Kristopher [name] DOB: 02/22/1941  REFERRING CLINICIAN: Dawley, Alan Mulder, DO HISTORY FROM: patient  REASON FOR VISIT: new consult   HISTORICAL  CHIEF COMPLAINT:  Chief Complaint  Patient presents with   New Patient (Initial Visit)    Rm 7. Accompanied by wife. NP/[facility]/neuropathy. He c/o neuropathy in the bottoms of his feet.    HISTORY OF PRESENT ILLNESS:   82 year old male here for evaluation of numbness, tingling, burning in bottom of feet since approximately 2021.  Patient has history of lumbar radiculopathy, status post surgery in the 1990s.  Has some history of borderline A1c elevation, up to 6.7 in 2022.  Also had worsening low back pain in 2022, underwent lumbar spine surgery with improvement in low back pain symptoms.  Due to persistence of numbness, tingling and pain in feet, patient referred here for neuropathy evaluation.  Has remote diagnosis of bilateral carpal tunnel syndrome, managed conservatively over the last 20 years.   REVIEW OF SYSTEMS: Full 14 system review of systems performed and negative with exception of: as per HPI.  ALLERGIES: Allergies  Allergen Reactions   Fire Ant Other (See Comments)    Burning and itching all over   Wasp Venom Other (See Comments)    Burning and itching all over    HOME MEDICATIONS: Outpatient Medications Prior to Visit  Medication Sig Dispense Refill   ALPRAZolam (XANAX) 0.5 MG tablet Take 0.5 mg by mouth at bedtime.     aluminum hydroxide-magnesium carbonate (GAVISCON) 95-358 MG/15ML SUSP Take 15 mLs by mouth daily as needed for indigestion or heartburn.     Artificial Tear Solution (SOOTHE XP OP) Apply 1 drop to eye daily as needed (dry eyes).     gabapentin (NEURONTIN) 300 MG capsule Take 1 capsule (300 mg total) by mouth 3 (three) times daily. 90 capsule 0   pantoprazole (PROTONIX) 20 MG tablet Take 1 tablet (20 mg total) by mouth daily before breakfast.  90 tablet 3   B Complex-C (SUPER B COMPLEX PO) Take 1 tablet by mouth daily.     EPINEPHrine 0.3 mg/0.3 mL IJ SOAJ injection Inject 0.3 mg into the muscle as needed for anaphylaxis.     HYDROmorphone (DILAUDID) 2 MG tablet Take 2-4 mg by mouth See admin instructions. 2 mg 4 times daily 4 mg twice daily     Melatonin 10 MG TABS Take 1 tablet by mouth at bedtime as needed (sleep).     methocarbamol (ROBAXIN-750) 750 MG tablet Take 1 tablet (750 mg total) by mouth 4 (four) times daily. (Patient taking differently: Take 1,500 mg by mouth 4 (four) times daily.) 90 tablet 0   naloxone (NARCAN) nasal spray 4 mg/0.1 mL Place 4 mg into the nose as needed (accidental overdose).     naproxen sodium (ALEVE) 220 MG tablet Take 220 mg by mouth daily as needed (pain).     oxyCODONE (OXY IR/ROXICODONE) 5 MG immediate release tablet Take 1 tablet (5 mg total) by mouth every 6 (six) hours as needed for up to 20 doses for breakthrough pain. 20 tablet 0   No facility-administered medications prior to visit.    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   + lactulose H2 breath test 08/2018 Try Xifaxan Tx    PAST SURGICAL HISTORY: Past Surgical History:  Procedure Laterality Date   COLONOSCOPY     x 2   COLONOSCOPY WITH PROPOFOL N/A 03/05/2017   Procedure: COLONOSCOPY WITH PROPOFOL;  Surgeon: Charolett Bumpers, MD;  Location: [facility];  Service: Endoscopy;  Laterality: N/A;   EYE SURGERY     LUMBAR LAMINECTOMY  19   TONSILLECTOMY  age 76   TRANSFORAMINAL LUMBAR INTERBODY FUSION W/  MIS 2 LEVEL Right 09/26/2021   Procedure: Minimally Invasive Transforaminal Lumbar Interbody Fusion, Lumbar Four-Five, Lumbar Five-Sacral One, RIGHT SIDE APPROACH;  Surgeon: Bethann Goo, DO;  Location: [facility] OR;  Service: Neurosurgery;  Laterality: Right;    FAMILY HISTORY: Family History  Problem Relation Age of Onset   COPD Father    Congestive Heart Failure Father    CAD Father    Dementia Father    Pancreatic cancer Mother    GER disease Mother    Anxiety disorder Mother    Glaucoma Maternal Aunt    Colon cancer Maternal Uncle    Asthma Neg Hx    Eczema Neg Hx    Urticaria Neg Hx    Immunodeficiency Neg Hx    Angioedema Neg Hx    Allergic rhinitis Neg Hx     SOCIAL HISTORY: Social History   Socioeconomic History   Marital status: Married    Spouse name: Not on file   Number of children: Not on file   Years of education: Not on file   Highest education level: Not on file  Occupational History   Not on file  Tobacco Use   Smoking status: Never   Smokeless tobacco: Never  Vaping Use   Vaping Use: Never used  Substance and Sexual Activity   Alcohol use: No   Drug use: No   Sexual activity: Yes  Other Topics Concern   Not on file  Social History Narrative   He is married he has a son and a daughter and grandchildren and they are in the area.  [place] but is been in his family for 200+ years I think.   He is a retired Chartered loss adjuster.   He does not smoke use tobacco ethanol or drugs.  He is a remote history of short-term tobacco chewing.   Social Determinants of Health   Financial Resource Strain: Not on file  Food Insecurity: Not on file  Transportation Needs: Not on file  Physical Activity: Not on file  Stress: Not on file  Social Connections: Not on file  Intimate Partner Violence: Not on file     PHYSICAL EXAM  GENERAL EXAM/CONSTITUTIONAL: Vitals:  Vitals:   06/17/23 1111  BP: 121/69  Pulse: 66  Weight: 201 lb 3.2  oz (91.3 kg)  Height: 5\' 9"  (1.753 m)   Body mass index is 29.71 kg/m. Wt Readings from Last 3 Encounters:  06/17/23 201 lb 3.2 oz (91.3 kg)  10/21/21 200 lb (90.7 kg)  09/26/21 200 lb (90.7 kg)   Patient is in no distress; well developed, nourished and groomed; neck is supple  CARDIOVASCULAR: Examination of carotid arteries is normal; no carotid bruits Regular rate and rhythm, no murmurs Examination  of peripheral vascular system by observation and palpation is normal  EYES: Ophthalmoscopic exam of optic discs and posterior segments is normal; no papilledema or hemorrhages No results found.  MUSCULOSKELETAL: Gait, strength, tone, movements noted in Neurologic exam below  NEUROLOGIC: MENTAL STATUS:      No data to display         awake, alert, oriented to person, place and time recent and remote memory intact normal attention and concentration language fluent, comprehension intact, naming intact fund of knowledge appropriate  CRANIAL NERVE:  2nd - no papilledema on fundoscopic exam 2nd, 3rd, 4th, 6th - pupils equal and reactive to light, visual fields full to confrontation, extraocular muscles intact, no nystagmus 5th - facial sensation symmetric 7th - facial strength symmetric 8th - hearing intact 9th - palate elevates symmetrically, uvula midline 11th - shoulder shrug symmetric 12th - tongue protrusion midline  MOTOR:  normal bulk and tone, full strength in the BUE, BLE; EXCEPT ATROPHY AND WEAKNESS OF BILATERAL APB  SENSORY:  normal and symmetric to light touch, temperature, vibration; EXCEPT DECR IN HANDS AND FEET  COORDINATION:  finger-nose-finger, fine finger movements normal  REFLEXES:  deep tendon reflexes TRACE and symmetric  GAIT/STATION:  narrow based gait     DIAGNOSTIC DATA (LABS, IMAGING, TESTING) - I reviewed patient records, labs, notes, testing and imaging myself where available.  Lab Results  Component Value Date   WBC 11.2 (H)  07/26/2022   HGB 14.6 07/26/2022   HCT 41.1 07/26/2022   MCV 99.3 07/26/2022   PLT 240 07/26/2022      Component Value Date/Time   NA 136 07/26/2022 1634   K 4.5 07/26/2022 1634   CL 103 07/26/2022 1634   CO2 21 (L) 07/26/2022 1634   GLUCOSE 154 (H) 07/26/2022 1634   BUN 16 07/26/2022 1634   CREATININE 1.45 (H) 07/26/2022 1634   CALCIUM 9.2 07/26/2022 1634   PROT 6.9 07/26/2022 1634   ALBUMIN 4.2 07/26/2022 1634   AST 22 07/26/2022 1634   ALT 15 07/26/2022 1634   ALKPHOS 69 07/26/2022 1634   BILITOT 0.9 07/26/2022 1634   GFRNONAA 48 (L) 07/26/2022 1634   GFRAA 56 (L) 02/13/2016 1406   No results found for: "CHOL", "HDL", "LDLCALC", "LDLDIRECT", "TRIG", "CHOLHDL" Lab Results  Component Value Date   HGBA1C 6.7 (H) 09/22/2021   Lab Results  Component Value Date   VITAMINB12 489 08/08/2018   No results found for: "TSH"   Component Ref Range & Units 2 yr ago  HX VITAMIN B12 200 - 900 pg/mL 319    09/29/21 MRI lumbar spine  1. Suspected subdural fluid (potentially CSF and/or hemorrhage in the post-operative setting) throughout the lumbar canal, probably extending into the visualized lower thoracic canal. 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 Right greater than left subarticular recess narrowing at L4-L5 and L5-S1 with possible residual disc on the right at L5-S1. 2. Extensive T1 hypointensity in the right L4-L5 and L5-S1 foramina, probably scar tissue in this recently postoperative patient. Associated architectural distortion of the foramina and complete effacement of fat on the right at these levels. Follow-up imaging with contrast may be useful to further assess granulation tissue vs bone/disc. 3. Moderate to severe left foraminal stenosis at L4-L5 and moderate left foraminal stenosis at L5-S1. 4. Distended  bladder, partially imaged.    ASSESSMENT AND PLAN  82 y.o. year  old male here with:  Dx:  1. Numbness and tingling of both feet     PLAN:  NUMBNESS PAIN IN FEET (gradual, progressive symptoms since 2021; suspect peripheral neuropathy; possibly related to prior diabetes A1c 6.7 in 2022; could be age related / idiopathic; will check add'l testing; also history of lumbar spine dz s/p surgery in 1990's and 2022)  - check neuropathy labs (EMG/NCS not likely to be helpful, since slow, gradual symptoms; low suspicion for autoimmune / demyelinating neuropathy; likely age related / idiopathic neuropathy)  - continue gabapentin up to 600mg  three times a day  - consider duloxetine 30-60mg  daily, amitriptyline 25-50mg  at bedtime, pregabalin 75-150mg  twice a day  - capsaicin cream, lidocaine patch / cream, alpha-lipoic acid 600mg  daily   NUMBNESS IN HANDS (bilateral carpal tunnel syndrome; dx'd 20 years ago) - continue wrist splints at bedtime    Orders Placed This Encounter  Procedures   Vitamin B12   SPEP with IFE   ANA w/Reflex   SSA, SSB   Return for return to PCP, pending if symptoms worsen or fail to improve, pending test results.    Suanne Marker, MD 06/17/2023, 12:17 PM Certified in Neurology, Neurophysiology and Neuroimaging  [facility] [address] [place], [place] [address] [phone]

## 2023-06-19 LAB — ANA W/REFLEX: ANA Titer 1: NEGATIVE

## 2023-06-21 LAB — MULTIPLE MYELOMA PANEL, SERUM

## 2023-06-21 LAB — VITAMIN B12: Vitamin B-12: 591 pg/mL (ref 232–1245)

## 2023-06-22 LAB — MULTIPLE MYELOMA PANEL, SERUM
Albumin SerPl Elph-Mcnc: 3.7 g/dL (ref 2.9–4.4)
B-Globulin SerPl Elph-Mcnc: 0.9 g/dL (ref 0.7–1.3)
Globulin, Total: 2.5 g/dL (ref 2.2–3.9)
IgA/Immunoglobulin A, Serum: 141 mg/dL (ref 61–437)
IgG (Immunoglobin G), Serum: 665 mg/dL (ref 603–1613)
IgM (Immunoglobulin M), Srm: 72 mg/dL (ref 15–143)
Total Protein: 6.2 g/dL (ref 6.0–8.5)

## 2023-06-22 LAB — SJOGREN'S SYNDROME ANTIBODS(SSA + SSB)
ENA SSA (RO) Ab: <0.2 AI (ref 0.0–0.9)
ENA SSB (LA) Ab: <0.2 AI (ref 0.0–0.9)

## 2024-07-14 ENCOUNTER — Encounter: Payer: Self-pay | Admitting: Neurology

## 2024-09-14 ENCOUNTER — Ambulatory Visit: Admitting: Neurology

## 2024-09-14 ENCOUNTER — Encounter: Payer: Self-pay | Admitting: Neurology

## 2024-09-14 VITALS — BP 143/91 | HR 57 | Ht 69.0 in | Wt 206.0 lb

## 2024-09-14 DIAGNOSIS — R2681 Unsteadiness on feet: Secondary | ICD-10-CM

## 2024-09-14 DIAGNOSIS — G629 Polyneuropathy, unspecified: Secondary | ICD-10-CM

## 2024-09-14 NOTE — Progress Notes (Signed)
 [facility] Clinic Note - Initial Visit   Date: 09/14/2024   Kristopher [name] MRN: [id number] DOB: 1941-02-21   Dear Dr. Onetha:  Thank you for your kind referral of Kristopher [name] for consultation of neuropathy. Although his history is well known to you, please allow us  to reiterate it for the purpose of our medical record. The patient was accompanied to the clinic by wife who also provides collateral information.     Kristopher [name] is a 83 y.o. right-handed male with GERD, s/p lumbar surgery (1990, 2022), and bilateral CTS presenting for evaluation of neuropathy.   IMPRESSION/PLAN: Idiopathic neuropathy affecting the feet.  Symptoms manifesting with numbness, tingling, and burning pain.  Neuropathy labs are normal.  No history of diabetes, alcohol use, or family history of neuropathy.  I had extensive discussion with the patient regarding the pathogenesis, etiology, management, and natural course of neuropathy. Neuropathy tends to be slowly progressive.  I discussed that in the vast majority of cases, despite checking for reversible causes, we are unable to find the underlying etiology and management is symptomatic.   - Pain is well-controlled on gabapentin  300mg  in the morning, 600mg  in the afternoon, and 300mg  at time. - Start physical therapy for balance training - Gait assistance with cane recommended - Patient educated on daily foot inspection, fall prevention, and safety precautions around the home.  Return to clinic as needed  ------------------------------------------------------------- History of present illness: For the past 4-5 years, he has numbness, tingling, and shock-like pain involving the soles of the feet.  He takes gabapentin  300mg  in the morning, 600mg  in the afternoon, and 300mg  at bedtime.  He has constant numbness.  He balance is fair.  He has weakness in the legs later in the day.  He was evaluated for neuropathy by Dr. Margaret at [facility] in  July 2024 at which time neuropathy labs including vitamin B12, SPEP with IFE, SSA/B, and ANA were normal.   No history of diabetes, alcohol use, or family history of neuropathy.  No history of cancer/chemotherapy.    He has history of lumbar surgery in 1990s and 2022.  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   MRI lumbar spine wo contrast 09/29/2021: 1. Suspected subdural fluid (potentially CSF and/or hemorrhage in the post-operative setting) throughout the lumbar canal, probably extending into the visualized lower thoracic canal. 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 Right greater than left subarticular recess narrowing at L4-L5 and L5-S1 with possible residual disc on the right at L5-S1. 2. Extensive T1 hypointensity in the right L4-L5 and L5-S1 foramina, probably scar tissue in this recently postoperative patient. Associated architectural distortion of the foramina and complete effacement of fat on the right at these levels. Follow-up imaging with contrast may be useful to further assess granulation tissue vs bone/disc. 3. Moderate to severe left foraminal stenosis at L4-L5 and moderate left foraminal stenosis at L5-S1. 4. Distended bladder, partially imaged.   Lab Results  Component Value Date   HGBA1C 6.7 (H) 09/22/2021   Lab Results  Component Value Date   [id number] 591 06/17/2023  Past Medical History:  Diagnosis Date   Allergic rhinitis    Biceps tendon rupture    C6 radiculopathy    Candida esophagitis (HCC)    after  prednsone Tx   Cataract    Colon adenoma    ED (erectile dysfunction)    Esophageal reflux    Frequent headaches    Hyperglyceridemia    Lateral epicondylitis    Lumbar radiculopathy    OA (osteoarthritis)    Plantar fasciitis    Pre-diabetes    Small intestinal bacterial overgrowth 08/20/2018   + lactulose H2 breath test 08/2018 Try Xifaxan  Tx    Past Surgical History:  Procedure Laterality Date   COLONOSCOPY     x 2   COLONOSCOPY WITH PROPOFOL  N/A 03/05/2017   Procedure: COLONOSCOPY WITH PROPOFOL ;  Surgeon: Gladis MARLA Louder, MD;  Location: [facility];  Service: Endoscopy;  Laterality: N/A;   EYE SURGERY     LUMBAR LAMINECTOMY  26   TONSILLECTOMY  age 83   TRANSFORAMINAL LUMBAR INTERBODY FUSION W/ MIS 2 LEVEL Right 09/26/2021   Procedure: Minimally Invasive Transforaminal Lumbar Interbody Fusion, Lumbar Four-Five, Lumbar Five-Sacral One, RIGHT SIDE APPROACH;  Surgeon: Carollee Lani BROCKS, DO;  Location: [facility] OR;  Service: Neurosurgery;  Laterality: Right;     Medications:  Outpatient Encounter Medications as of 09/14/2024  Medication Sig   ALPRAZolam  (XANAX ) 0.5 MG tablet Take 0.5 mg by mouth at bedtime.   aluminum  hydroxide-magnesium  carbonate (GAVISCON) 95-358 MG/15ML SUSP Take 15 mLs by mouth daily as needed for indigestion or heartburn.   Artificial Tear Solution (SOOTHE XP OP) Apply 1 drop to eye daily as needed (dry eyes).   cyanocobalamin  (VITAMIN B12) 500 MCG tablet Take 500 mcg by mouth daily.   gabapentin  (NEURONTIN ) 300 MG capsule Take 1 capsule (300 mg total) by mouth 3 (three) times daily. (Patient taking differently: Take 300 mg by mouth 3 (three) times daily. 600 mg in the morning and 300 mg at lunchtime, and 600 mg at bedtime.)   pantoprazole  (PROTONIX ) 20 MG tablet Take 1 tablet (20 mg total) by mouth daily before breakfast.   No facility-administered encounter medications on file as of 09/14/2024.    Allergies:  Allergies  Allergen Reactions   Fire Ant Other  (See Comments)    Burning and itching all over   Wasp Venom Other (See Comments)    Burning and itching all over    Family History: Family History  Problem Relation Age of Onset   COPD Father    Congestive Heart Failure Father    CAD Father    Dementia Father    Pancreatic cancer Mother    GER disease Mother    Anxiety disorder Mother    Glaucoma Maternal Aunt    Colon cancer Maternal Uncle    Asthma Neg Hx    Eczema Neg Hx    Urticaria Neg Hx    Immunodeficiency Neg Hx    Angioedema Neg Hx    Allergic rhinitis Neg Hx     Social History: Social History   Tobacco Use   Smoking status: Never   Smokeless tobacco: Never  Vaping Use   Vaping status: Never Used  Substance Use Topics   Alcohol use: No   Drug use: No   Social History   Social History Narrative   He is married he has a son and a daughter and grandchildren and they are in the area.  [place] but is been in his  family for 200+ years I think.   He is a retired [employer] loss adjuster.   He does not smoke use tobacco ethanol or drugs.  He is a remote history of short-term tobacco chewing.      Are you right handed or left handed? Right Handed    Are you currently employed ? No    What is your current occupation? Retired 19 years    Do you live at home alone? No    Who lives with you? Wife    What type of home do you live in: 1 story or 2 story? Lives in a two story home        Vital Signs:  BP (!) 143/91   Pulse (!) 57   Ht 5' 9 (1.753 m)   Wt 206 lb (93.4 kg)   SpO2 95%   BMI 30.42 kg/m   Neurological Exam: MENTAL STATUS including orientation to time, place, person, recent and remote memory, attention span and concentration, language, and fund of knowledge is normal.  Speech is not dysarthric.  CRANIAL NERVES: II:  No visual field defects.     III-IV-VI: Pupils equal round and reactive to light.  Normal conjugate, extra-ocular eye movements in all directions of gaze.  No  nystagmus.  No ptosis.   V:  Normal facial sensation.    VII:  Normal facial symmetry and movements.   VIII:  Normal hearing and vestibular function.   IX-X:  Normal palatal movement.   XI:  Normal shoulder shrug and head rotation.   XII:  Normal tongue strength and range of motion, no deviation or fasciculation.  MOTOR:  No atrophy, fasciculations or abnormal movements.  No pronator drift.   Upper Extremity:  Right  Left  Deltoid  5/5   5/5   Biceps  5/5   5/5   Triceps  5/5   5/5   Wrist extensors  5/5   5/5   Wrist flexors  5/5   5/5   Finger extensors  5/5   5/5   Finger flexors  5/5   5/5   Dorsal interossei  5/5   5/5   Tone (Ashworth scale)  0  0   Lower Extremity:  Right  Left  Hip flexors  5/5   5/5   Knee flexors  5/5   5/5   Knee extensors  5/5   5/5   Dorsiflexors  5/5   5/5   Plantarflexors  5/5   5/5   Toe extensors  5/5   5/5   Toe flexors  5/5   5/5   Tone (Ashworth scale)  0  0   MSRs:                                           Right        Left brachioradialis 2+  2+  biceps 2+  2+  triceps 2+  2+  patellar 2+  2+  ankle jerk 2+  2+  Hoffman no  no  plantar response down  down   SENSORY:  Vibration is absent at the great toe bilaterally.  Pin prick show hyperesthesia in the feet bilaterally.  Temperature is intact throughout.  Rhomberg sign is positive.   COORDINATION/GAIT: Normal finger-to- nose-finger.  Intact rapid alternating movements bilaterally.  Gait is mildly wide-based, unassisted, stable.  Stressed gait is  intact.  Unsteady with tandem gait.    Thank you for allowing me to participate in patient's care.  If I can answer any additional questions, I would be pleased to do so.    Sincerely,    Bell Carbo K. Tobie, DO

## 2024-09-14 NOTE — Patient Instructions (Addendum)
 Happy Anniversary!!!  Check feet daily  Continue gabapentin   Start physical therapy for balance training  Recommend using a cane, especially on uneven ground  Install hand bars in the bathroom
